# Patient Record
Sex: Female | Born: 1980 | Race: White | Hispanic: No | Smoking: Never smoker
Health system: Southern US, Community
[De-identification: ages and names within clinical notes are randomized; demographics above are authoritative.]

## PROBLEM LIST (undated history)

## (undated) ENCOUNTER — Inpatient Hospital Stay (HOSPITAL_COMMUNITY): Payer: Self-pay

## (undated) DIAGNOSIS — Z8619 Personal history of other infectious and parasitic diseases: Secondary | ICD-10-CM

## (undated) DIAGNOSIS — J45909 Unspecified asthma, uncomplicated: Secondary | ICD-10-CM

## (undated) DIAGNOSIS — O09899 Supervision of other high risk pregnancies, unspecified trimester: Secondary | ICD-10-CM

## (undated) DIAGNOSIS — Z8489 Family history of other specified conditions: Secondary | ICD-10-CM

## (undated) DIAGNOSIS — O99345 Other mental disorders complicating the puerperium: Secondary | ICD-10-CM

## (undated) DIAGNOSIS — N200 Calculus of kidney: Secondary | ICD-10-CM

## (undated) DIAGNOSIS — D649 Anemia, unspecified: Secondary | ICD-10-CM

## (undated) DIAGNOSIS — O09219 Supervision of pregnancy with history of pre-term labor, unspecified trimester: Secondary | ICD-10-CM

## (undated) DIAGNOSIS — F53 Postpartum depression: Secondary | ICD-10-CM

## (undated) DIAGNOSIS — E119 Type 2 diabetes mellitus without complications: Secondary | ICD-10-CM

## (undated) DIAGNOSIS — F419 Anxiety disorder, unspecified: Secondary | ICD-10-CM

## (undated) HISTORY — DX: Postpartum depression: F53.0

## (undated) HISTORY — PX: LITHOTRIPSY: SUR834

## (undated) HISTORY — DX: Other mental disorders complicating the puerperium: O99.345

## (undated) HISTORY — DX: Supervision of pregnancy with history of pre-term labor, unspecified trimester: O09.219

## (undated) HISTORY — DX: Personal history of other infectious and parasitic diseases: Z86.19

## (undated) HISTORY — PX: NO PAST SURGERIES: SHX2092

## (undated) HISTORY — PX: WISDOM TOOTH EXTRACTION: SHX21

## (undated) HISTORY — DX: Supervision of other high risk pregnancies, unspecified trimester: O09.899

---

## 2001-01-10 ENCOUNTER — Other Ambulatory Visit: Admission: RE | Admit: 2001-01-10 | Discharge: 2001-01-10 | Payer: Self-pay | Admitting: Obstetrics and Gynecology

## 2001-07-01 ENCOUNTER — Inpatient Hospital Stay (HOSPITAL_COMMUNITY): Admission: RE | Admit: 2001-07-01 | Discharge: 2001-07-04 | Payer: Self-pay | Admitting: Obstetrics and Gynecology

## 2003-06-07 ENCOUNTER — Encounter: Payer: Self-pay | Admitting: Urology

## 2003-06-07 ENCOUNTER — Ambulatory Visit (HOSPITAL_COMMUNITY): Admission: RE | Admit: 2003-06-07 | Discharge: 2003-06-07 | Payer: Self-pay | Admitting: Urology

## 2003-06-19 ENCOUNTER — Ambulatory Visit (HOSPITAL_COMMUNITY): Admission: RE | Admit: 2003-06-19 | Discharge: 2003-06-19 | Payer: Self-pay | Admitting: Urology

## 2003-06-19 ENCOUNTER — Encounter: Payer: Self-pay | Admitting: Urology

## 2003-06-26 ENCOUNTER — Ambulatory Visit (HOSPITAL_COMMUNITY): Admission: RE | Admit: 2003-06-26 | Discharge: 2003-06-26 | Payer: Self-pay | Admitting: Urology

## 2003-06-26 ENCOUNTER — Encounter: Payer: Self-pay | Admitting: Urology

## 2003-10-25 ENCOUNTER — Ambulatory Visit (HOSPITAL_COMMUNITY): Admission: RE | Admit: 2003-10-25 | Discharge: 2003-10-25 | Payer: Self-pay | Admitting: Family Medicine

## 2004-08-09 ENCOUNTER — Emergency Department (HOSPITAL_COMMUNITY): Admission: EM | Admit: 2004-08-09 | Discharge: 2004-08-09 | Payer: Self-pay | Admitting: Emergency Medicine

## 2005-12-01 ENCOUNTER — Ambulatory Visit (HOSPITAL_COMMUNITY): Admission: RE | Admit: 2005-12-01 | Discharge: 2005-12-01 | Payer: Self-pay | Admitting: Urology

## 2007-08-10 ENCOUNTER — Emergency Department (HOSPITAL_COMMUNITY): Admission: EM | Admit: 2007-08-10 | Discharge: 2007-08-10 | Payer: Self-pay | Admitting: Emergency Medicine

## 2008-01-25 ENCOUNTER — Other Ambulatory Visit: Admission: RE | Admit: 2008-01-25 | Discharge: 2008-01-25 | Payer: Self-pay | Admitting: Obstetrics and Gynecology

## 2008-10-03 ENCOUNTER — Inpatient Hospital Stay (HOSPITAL_COMMUNITY): Admission: AD | Admit: 2008-10-03 | Discharge: 2008-10-05 | Payer: Self-pay | Admitting: Obstetrics and Gynecology

## 2008-11-06 ENCOUNTER — Inpatient Hospital Stay (HOSPITAL_COMMUNITY): Admission: AD | Admit: 2008-11-06 | Discharge: 2008-11-06 | Payer: Self-pay | Admitting: Obstetrics and Gynecology

## 2009-03-07 ENCOUNTER — Inpatient Hospital Stay (HOSPITAL_COMMUNITY): Admission: AD | Admit: 2009-03-07 | Discharge: 2009-03-09 | Payer: Self-pay | Admitting: Obstetrics and Gynecology

## 2010-09-21 IMAGING — US US OB DETAIL+14 WK
2 series · 14 of 28 positions shown · non-contrast
Comparison: none

OBSTETRICAL ULTRASOUND:
 This ultrasound exam was performed in the [HOSPITAL] Ultrasound Department.  The OB US report was generated in the AS system, and faxed to the ordering physician.  This report is also available in [REDACTED] PACS.

[Series 1: us ob detail +14 wk · 13 of 84 slices shown (1 of 2)]
[im 4/84]
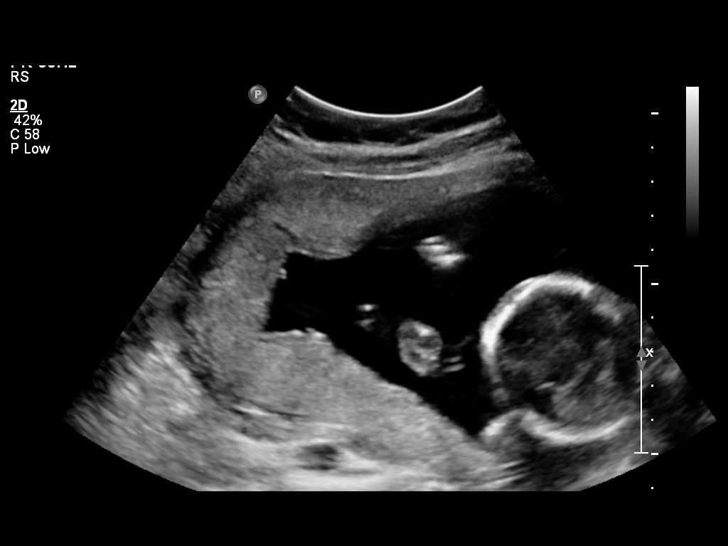
[im 10/84]
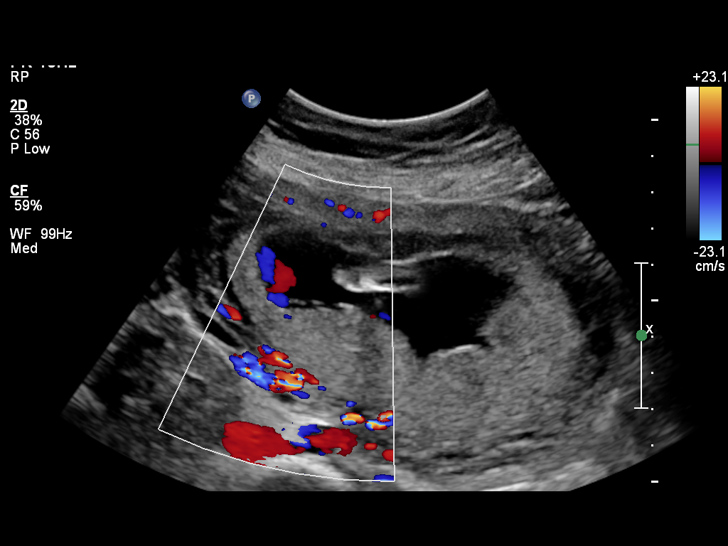
[im 16/84]
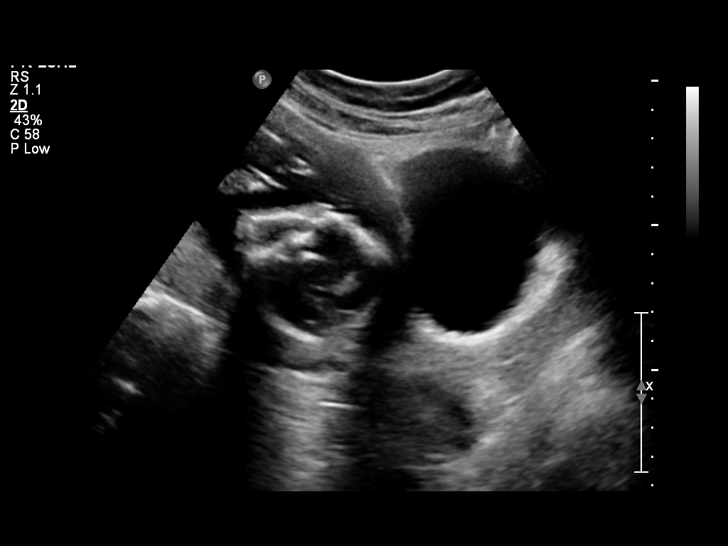
[im 23/84]
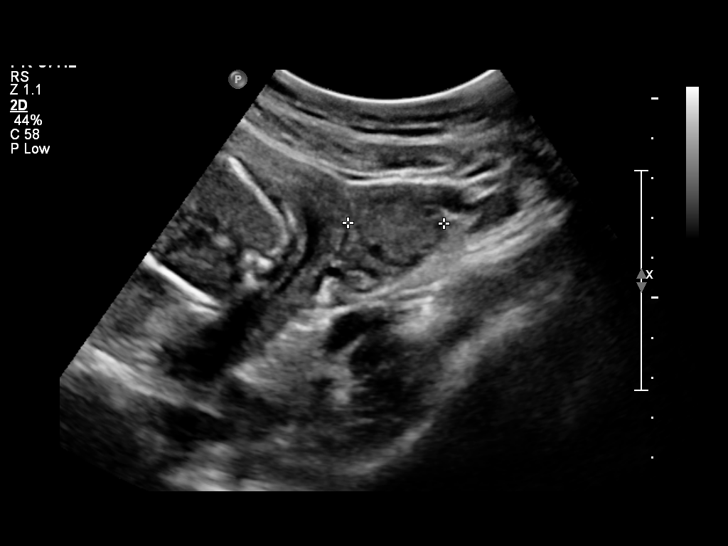
[im 29/84]
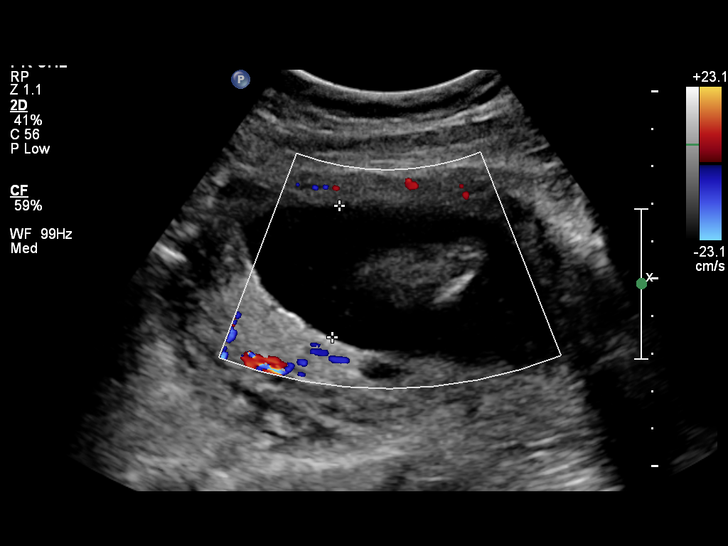
[im 36/84]
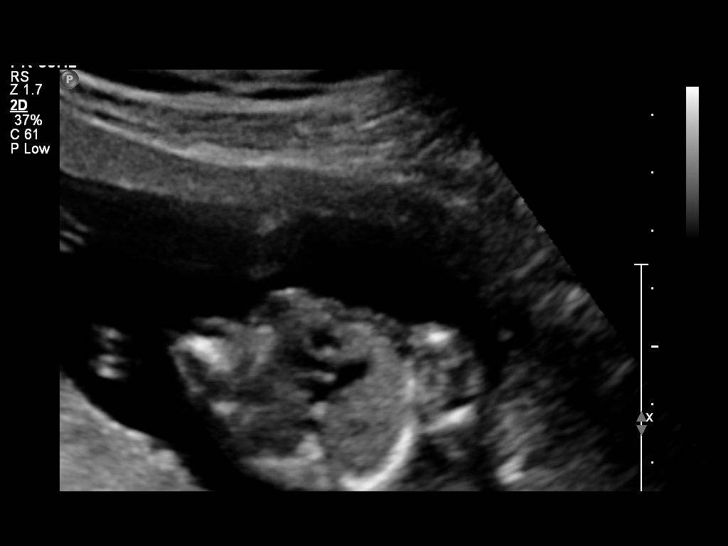
[im 42/84]
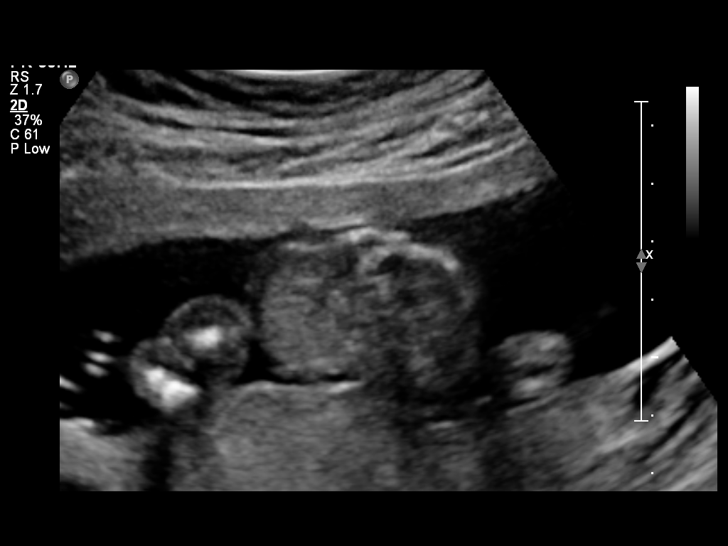
[im 48/84]
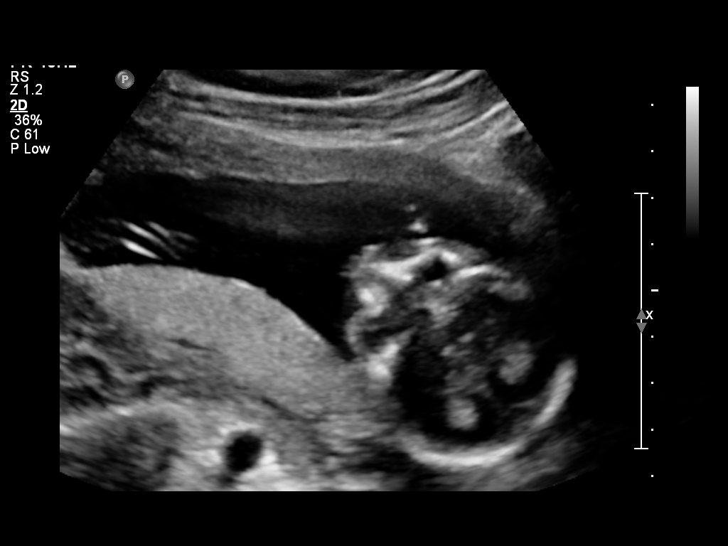
[im 55/84]
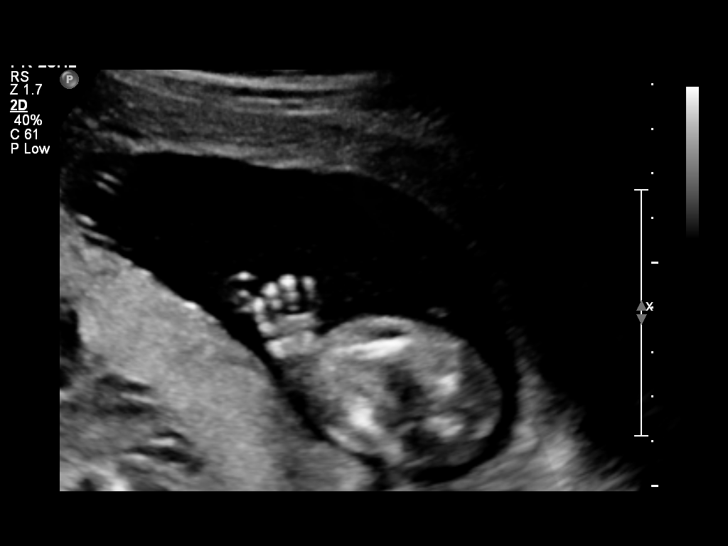
[im 61/84]
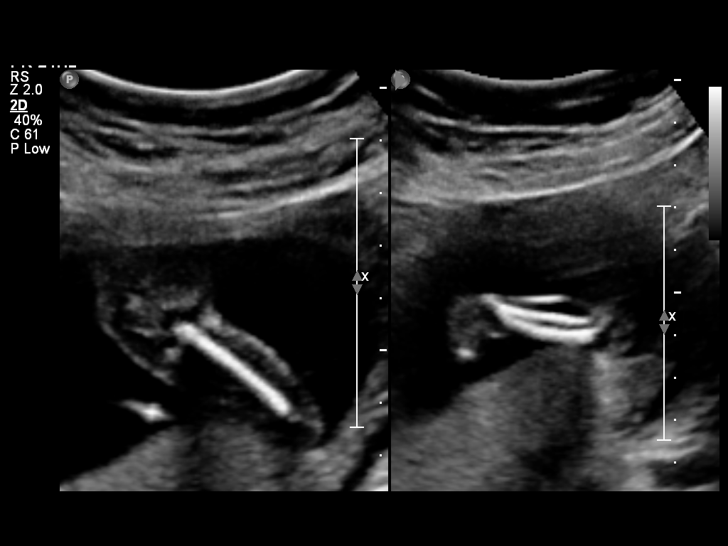
[im 68/84]
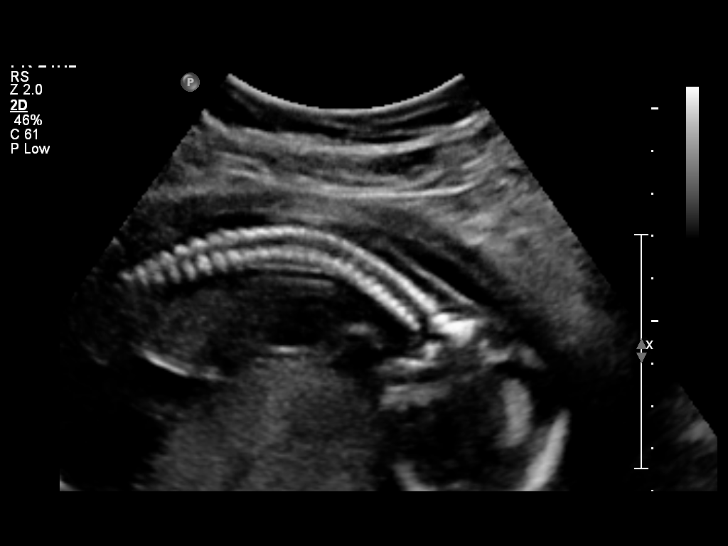
[im 74/84]
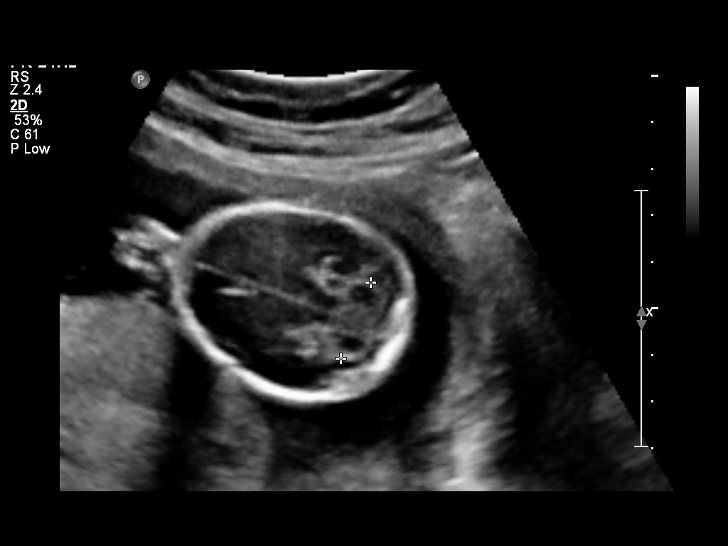
[im 80/84]
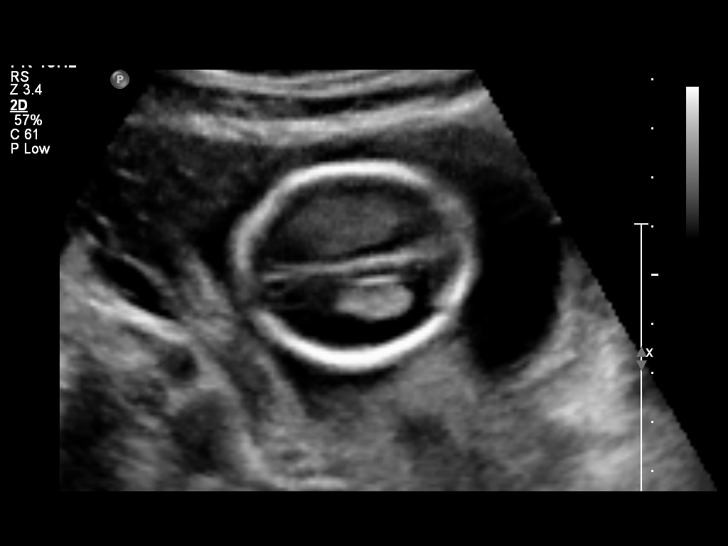

[Series 1: us ob detail +14 wk · 1 of 3 slices shown (2 of 2)]
[im 1/3]
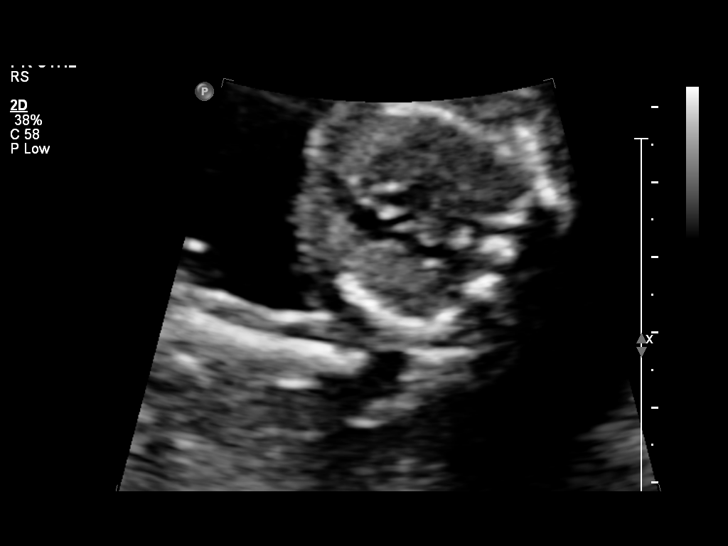

[14 of 28 positions shown; findings below may reference images not displayed]

IMPRESSION: See AS Obstetric US report.

## 2010-09-21 IMAGING — US US RENAL
1 series · 13 of 25 positions shown · non-contrast
Comparison: CT on 12/01/2005

CLINICAL DATA: 19 weeks estimated gestational age with pain and
known kidney stones.

RENAL/URINARY TRACT ULTRASOUND
TECHNIQUE: Complete ultrasound examination of the urinary tract
was performed including evaluation of the kidneys, renal collecting
systems, and urinary bladder.

[Series 1: us renal · 13 of 46 slices shown]
[im 1/46]
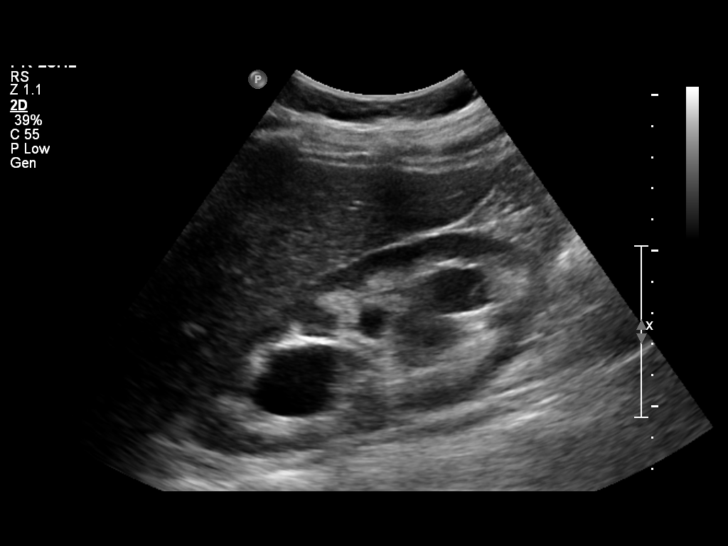
[im 4/46]
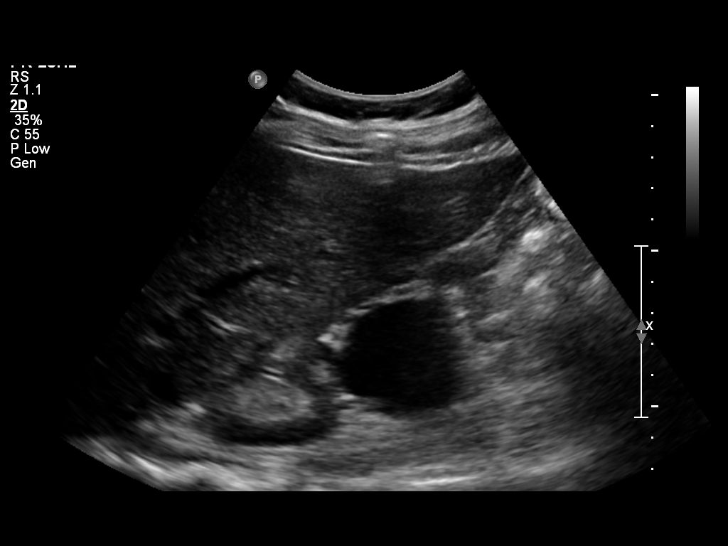
[im 8/46]
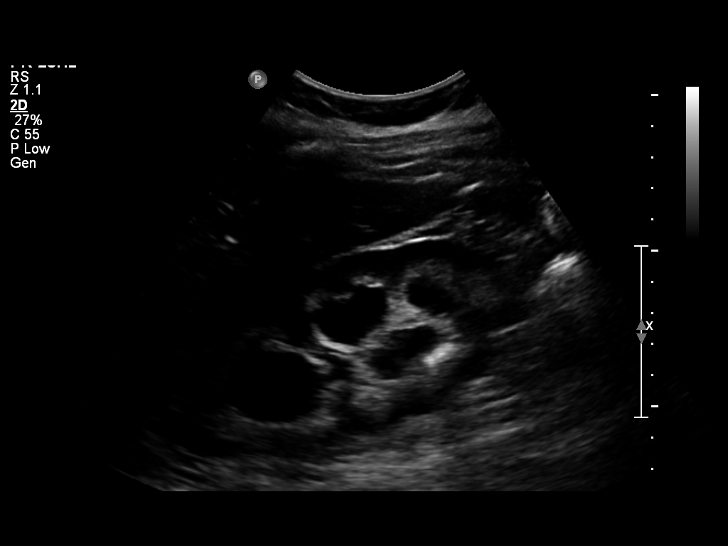
[im 12/46]
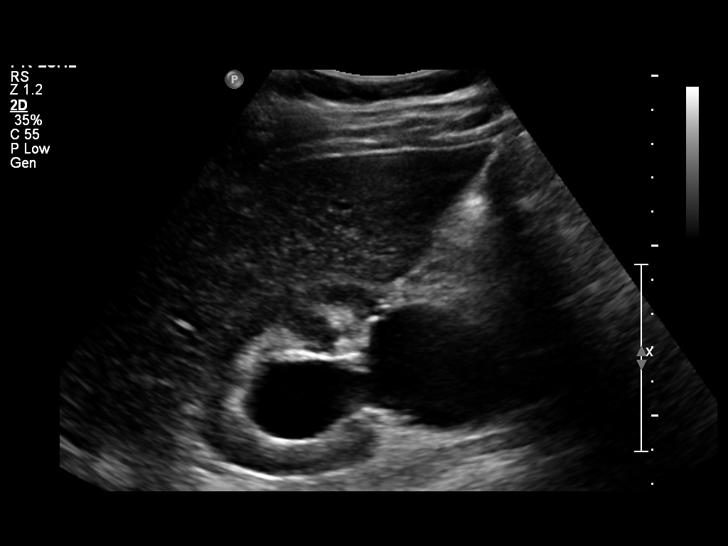
[im 16/46]
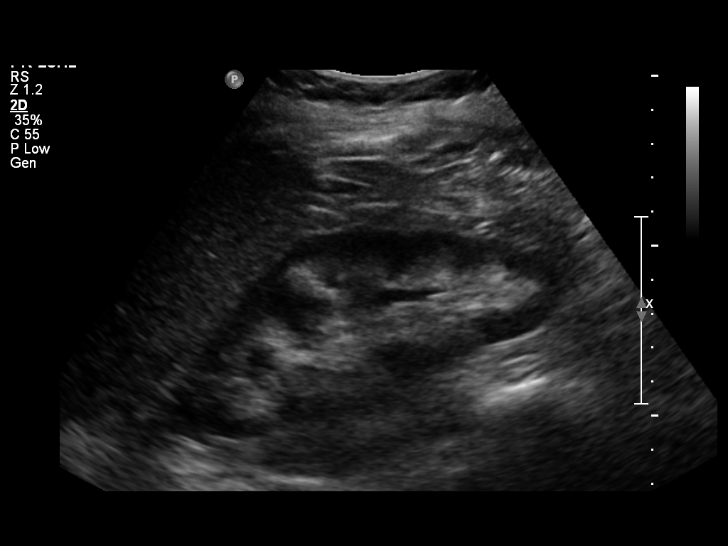
[im 19/46]
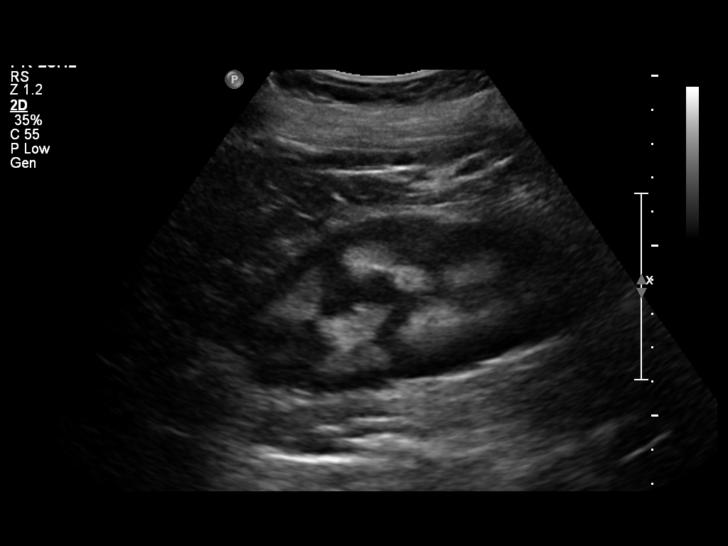
[im 23/46]
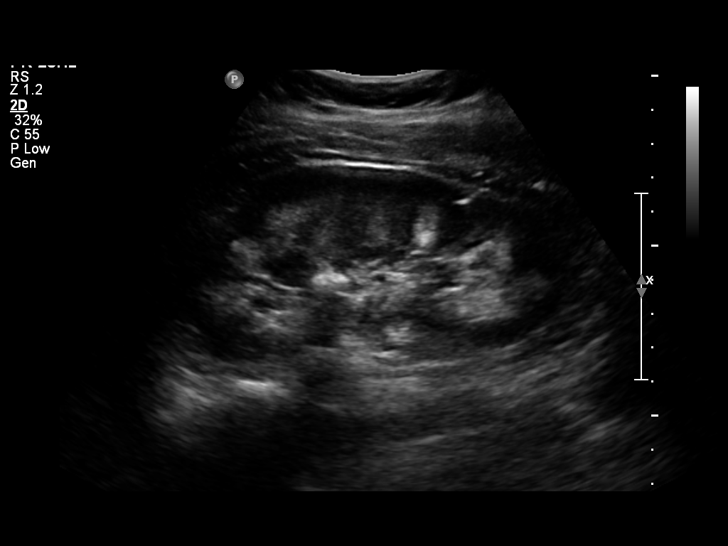
[im 27/46]
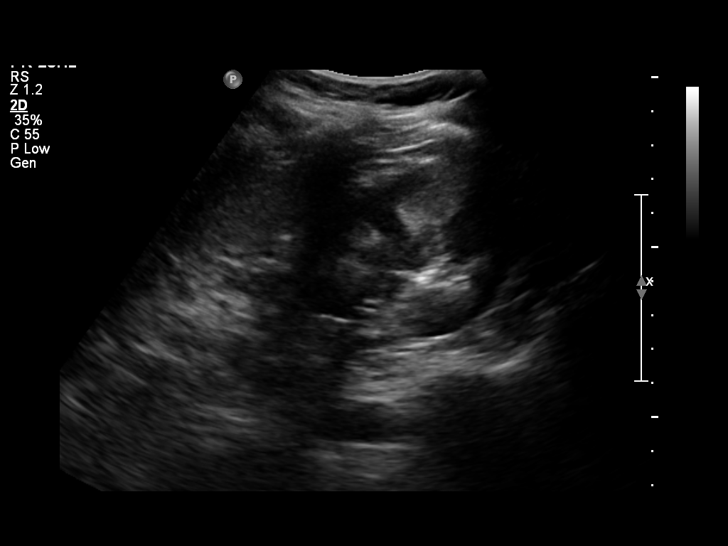
[im 31/46]
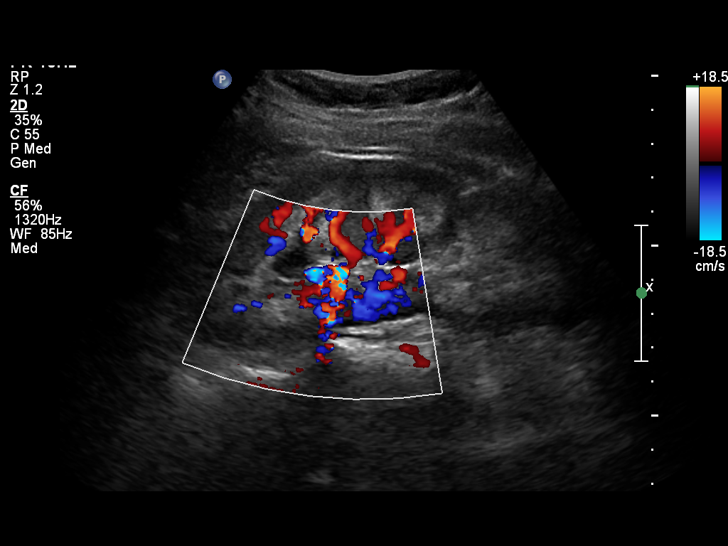
[im 34/46]
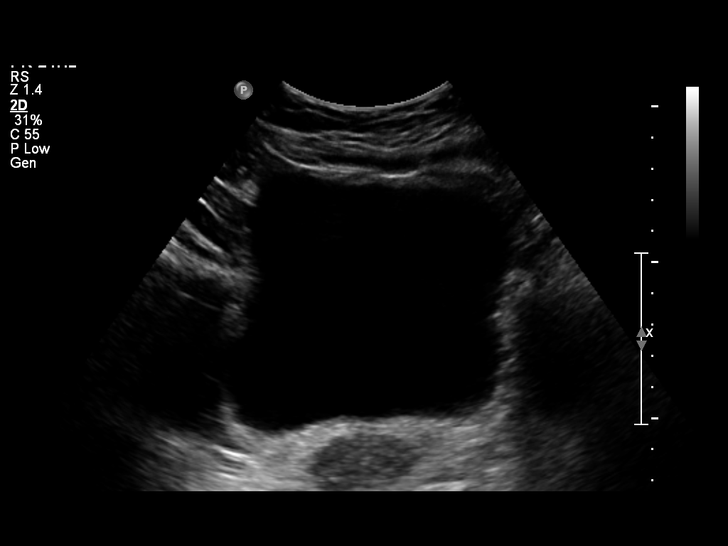
[im 38/46]
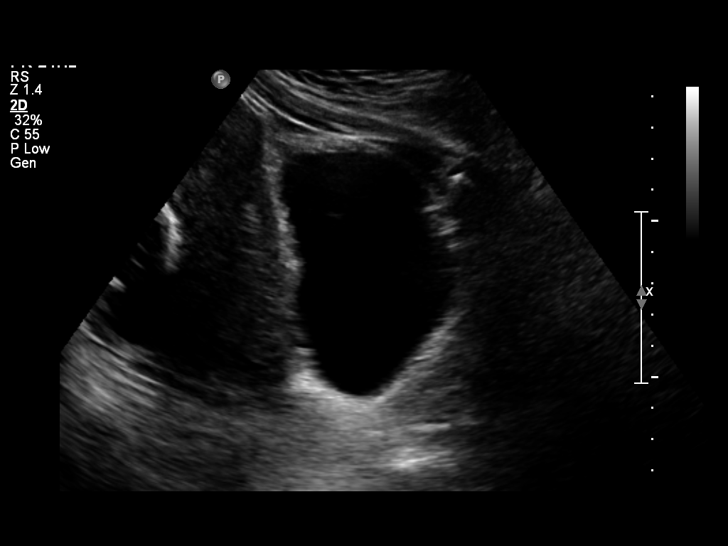
[im 42/46]
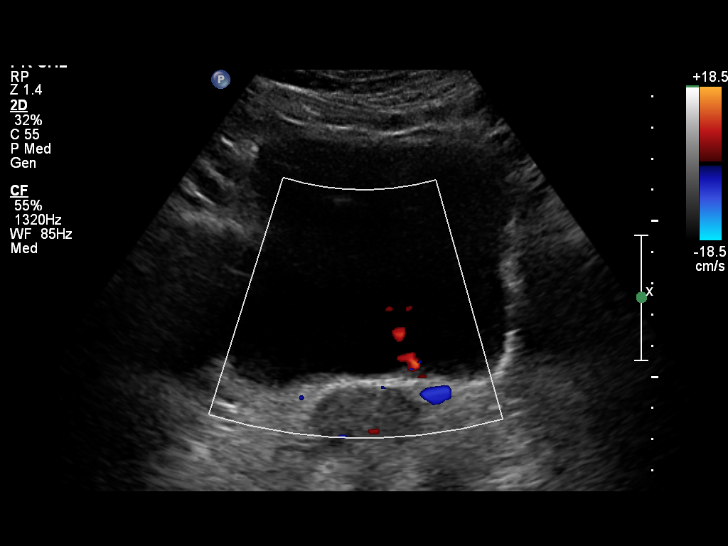
[im 46/46]
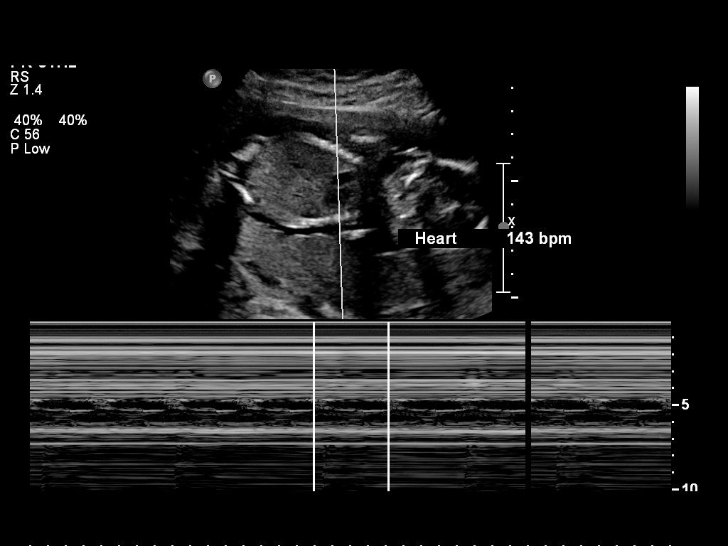

[13 of 25 positions shown; findings below may reference images not displayed]

FINDINGS: The right kidney demonstrates a sagittal length of
cm and the left kidney demonstrates a sagittal length of 12.4 cm.
Changes of bilateral medullary nephrocalcinosis are noted and this
was appreciated on prior CT dated 12/01/2005.  There is moderate
hydronephrosis identified on the right side with associated
caliectasis.  Dilatation of the renal pelvis and proximal aspect of
the right ureter was present.  Shadowing from bowel was present
overlying the proximal aspect of the ureter and a definite site of
obstruction was not seen.  No definite distention of the distal
aspect of the ureter was seen sonographically.  No evidence for a
right ureteral jet is noted and this constellation of findings is
concerning for a functionally obstructive process despite
nonvisualization of an obstructing renal stone.

No hydronephrosis is identified on the left and a strong left
ureteral jet was seen.

Bilateral renal calculi are appreciated on the right in the lower
pole measuring 8.7 mm and on the left in the upper pole measuring
4.0 and 6.0 meters directly adjacent to one another.

No perirenal fluid is noted.  The bladder has a normal filled
appearance.
IMPRESSION: Bilateral medullary nephrocalcinosis with bilateral renal calculi
identified having sizes and locations as noted.  Moderate
hydronephrosis is noted on the right with associated caliectasis
and proximal ureterectasis.  The distal ureter did not appear
distended but no right ureteral jet was identified during several
minutes of observation and this constellation of findings  is
worrisome for a functional obstruction on the right side possibly
from a non visualized right ureteral stone.

These study results were called to the patient's nurse on the third
floor.

## 2010-12-28 LAB — CBC
HCT: 26.3 % — ABNORMAL LOW (ref 36.0–46.0)
HCT: 31 % — ABNORMAL LOW (ref 36.0–46.0)
Hemoglobin: 10.9 g/dL — ABNORMAL LOW (ref 12.0–15.0)
Hemoglobin: 9.3 g/dL — ABNORMAL LOW (ref 12.0–15.0)
MCHC: 35.1 g/dL (ref 30.0–36.0)
MCV: 92.5 fL (ref 78.0–100.0)
Platelets: 169 10*3/uL (ref 150–400)
Platelets: 225 10*3/uL (ref 150–400)
RBC: 2.85 MIL/uL — ABNORMAL LOW (ref 3.87–5.11)
RBC: 3.35 MIL/uL — ABNORMAL LOW (ref 3.87–5.11)
WBC: 16.6 10*3/uL — ABNORMAL HIGH (ref 4.0–10.5)

## 2010-12-28 LAB — RPR: RPR Ser Ql: NONREACTIVE

## 2011-01-04 LAB — URINALYSIS, ROUTINE W REFLEX MICROSCOPIC
Bilirubin Urine: NEGATIVE
Ketones, ur: NEGATIVE mg/dL
Nitrite: NEGATIVE
Urobilinogen, UA: 0.2 mg/dL (ref 0.0–1.0)
pH: 6.5 (ref 5.0–8.0)

## 2011-01-04 LAB — URINE MICROSCOPIC-ADD ON

## 2011-01-05 LAB — URINALYSIS, ROUTINE W REFLEX MICROSCOPIC
Nitrite: NEGATIVE
Protein, ur: NEGATIVE mg/dL
Specific Gravity, Urine: 1.01 (ref 1.005–1.030)
Urobilinogen, UA: 0.2 mg/dL (ref 0.0–1.0)

## 2011-01-05 LAB — URINE MICROSCOPIC-ADD ON

## 2011-02-02 NOTE — Discharge Summary (Signed)
[name], Lauren [name]               ACCOUNT NO.:  [id number]   MEDICAL RECORD NO.:  [id number]          PATIENT TYPE:  INP   LOCATION:  9320                          FACILITY:  [facility]   PHYSICIAN:  Dineen Kid. Rana Snare, [name]    DATE OF BIRTH:  December 23, 1980   DATE OF ADMISSION:  10/03/2008  DATE OF DISCHARGE:  10/05/2008                               DISCHARGE SUMMARY   ADMITTING DIAGNOSES:  1. Intrauterine pregnancy at 18-1/2 weeks' estimated gestational age.  2. Hematuria suspect renal calculi.   DISCHARGE DIAGNOSES:  1. Intrauterine pregnancy at 61 weeks' estimated gestational age.  2. Nephrolithiasis.   HOSPITAL COURSE:  The patient is a 30 year old white married female that  was admitted to [facility] at 87 and 4 weeks' estimated  gestational age with complaints of flank pain with positive hematuria.  The patient was in moderate distress on admission.  Fetal heart tones  were in 140s.  The patient was now admitted for pain management and  workup regarding possible nephrolithiasis.  The patient was started on a  PCA pump for her pain management.  Urine was being strained on the  following morning.  The patient continued to have some intermittent  colicky right flank pain.  She did report some occasional fetal  movement.  Vital signs were stable.  She is afebrile.  Abdomen was soft  with some right flank pain, which was relieved with a PCA pump.  Uterus  was gravid at about a -1 station.  Urinalysis had revealed moderate  hematuria, and negative for nitrites, negative for leukocyte esterase.  The preliminary CT scan had revealed some right hydronephrosis without  Uro-jet seen.  Her renal consult was made and OB ultrasound was  scheduled.  Decision was made for conservative treatment.  She continued  to strain urine with p.r.n. narcotics for pain and nausea.  If pain  increased, decision was made to perhaps schedule an MRI to look for a  stone and a possible cystoscopy and  urethroscopic with a right  retrograde pyelogram and double-J stent placement.  The patient was in  agreement with the plan.  On the following morning, the patient was  reported that the pain seemed to be much better with a question whether  she had possibly been passed a stone.  Vital signs were stable.  She had  noted the flank pain at that time.  Abdomen continued to be soft.  Decision was made to discharge the patient to home with follow up in the  office in 3-4 days.   CONDITION ON DISCHARGE:  Stable.   DIET:  Regular as tolerated.   ACTIVITY:  Up as tolerated.  She is to call for increase in flank pain,  fever, chills, or increase in dysuria.  Prescription medication given  for Tylox #30, 1 p.o. daily.  Continue prenatal vitamins 1 p.o. daily.      Julio Sicks, N.P.      Dineen Kid Rana Snare, [name]  Electronically Signed    CC/MEDQ  D:  10/30/2008  T:  10/30/2008  Job:  [id number]

## 2011-02-02 NOTE — Consult Note (Signed)
NAMEJAYLEENE, GLAESER               ACCOUNT NO.:  [id number]   MEDICAL RECORD NO.:  [id number]          PATIENT TYPE:  INP   LOCATION:  9320                          FACILITY:  [facility]   PHYSICIAN:  Sigmund I. Patsi Sears, M.D.DATE OF BIRTH:  1980/10/27   DATE OF CONSULTATION:  DATE OF DISCHARGE:                                 CONSULTATION   CHIEF COMPLAINT:  Right flank pain x24 hours and history of kidney  stones.   HISTORY OF PRESENT ILLNESS:  This is a 30 year old female that is  admitted to [facility] on October 03, 2008, for intermittent  colicky right flank pain with a history of kidney stones in the past  treated by a lithotripsy.  Ms. Sangiovanni is 74 weeks' intrauterine  pregnancy and has been having continual right flank pain for several  days, and at this point is being admitted for IV hydration and pain  control.   ALLERGIES:  No known drug allergies.   FAMILY HISTORY:  Unknown.   SOCIAL HISTORY:  A 30 year old female that denies any history of  tobacco, drug, or alcohol use.   SURGICAL HISTORY:  Status post lithotripsy in the past.   MEDICATIONS:  1. Stadol 1 mg IV p.r.n.  2. Phenergan 12.5 mg p.r.n.  3. Morphine sulfate PCA.  4. Zofran 8 mg IV q.8 h. p.r.n.  5. Compazine 5-10 mg IV q.6 h. p.r.n.  6. Benadryl 25 mg p.o. q.6 h. p.r.n.   PHYSICAL EXAMINATION:  GENERAL:  A well-developed, well-nourished 71-  year-old white female in no acute distress.  VITAL SIGNS:  Today are blood pressure 115/74, respirations 20, pulse  74, temperature 97.7, room air sats of 100.  HEENT:  Normocephalic, atraumatic.  Pupils equal and reactive to light.  HEART:  Regular rhythm and rate without murmurs, gallops, or rubs.  LUNGS:  Clear to auscultation.  ABDOMEN:  Soft, nontender.  Positive right CVA tenderness.  Gravid  uterus.  GU/RECTAL:  Deferred.  SKIN:  Normal inspection.  PSYCHOLOGIC:  Alert, oriented, and cooperative.   LABORATORY DATA:  Urinalysis shows specific  gravity of 1.015, pH is 6.5,  shows a moderate blood and otherwise negative UA.  Microscopic shows  wbc's 0-2 and rbc's 3-6.  Renal ultrasound on October 04, 2008, shows  bilateral medullary nephrocalcinosis with bilateral renal calculi  identified, having sizes and locations as noted.  Moderated  hydronephrosis is noted on the right with associated calcinosis and  proximally ureterectasis.  The distal ureter did not appear distended,  but no right ureteral jet was identified during several minutes of  observation.  This constellation of findings is worrisome for a  functional obstruction of the right side, possibly from a nonvisualized  right renal stone.   ASSESSMENT AND PLAN:  History of kidney stones, right renal flank pain,  and hydronephrosis.  Discussed in detail with the patient and family  members conservative treatments and she is 19 weeks' intrauterine  pregnancy, to have the patient continue to strain her urine, controlled  with p.r.n. narcotics for pain and nausea, and plan would be at OB's  discretion to  discharge home on conservative pain medicine and to  followup in our office for a renal ultrasound.  I did discuss if the  patient becomes more acute in regards of, i.e., septic or developed  severe pain that was not controlled then we would most likely recommend  an MRI to look for a stone and also possible cystoscopy, ureteroscopy,  and right retrograde polygram, and double J-stent placement by Dr.  Patsi Sears.  The patient is in agreement with this plan at this time and  we will follow at a distance, please notify us if you need [place] in the  future.      Jetta Lout, NP      Sigmund I. Patsi Sears, [name]  Electronically Signed    DW/MEDQ  D:  10/04/2008  T:  10/05/2008  Job:  [id number]

## 2011-02-05 NOTE — Op Note (Signed)
[facility]  Patient:    Lauren [name], Lauren [name] Visit Number: [id number] MRN: [id number]          Service Type: OBS Location: 4 A427 01 Attending Physician:  Tilda Burrow Dictated by:   Langley Gauss, [name] Proc. Date: 07/01/01 Admit Date:  07/01/2001 Discharge Date: 07/04/2001   CC:         [name], [name]   Operative Report  PROCEDURE:  Placement of continuous lumbar analgesia at the L3-L4 interspace.  SURGEON:  Langley Gauss, [name]  COMPLICATIONS:  None.  SPECIMENS:  None.  SUMMARY:  Appropriate informed consent was obtained.  Continuous electronic fetal monitoring was performed.  The patient was placed in the standard position at which time bony landmarks were identified.  The L3-L4 interspace was chosen.  The patients back is sterilely prepped and draped in the usual manner.  Then 5 cc of 1% lidocaine is injected at the midline of the L3-L4 interspace to raise a small skin wheal.  The 17-gauge Tuohy-Schliff needle is then utilized with loss of resistance from the air-filled glass syringe to identify entry into the epidural space; first attempt without difficulty. Test dose 5 cc of 1.5% lidocaine plus epinephrine was injected through the epidural needle.  No signs of CSF or intravascular injection obtained.  Thus, the epidural catheter was inserted to a depth of 4 cm.  Epidural needle was removed.  Aspiration test was negative.  A second test dose of 2 cc 1.5% lidocaine plus epinephrine was injected through the epidural catheter.  Again, no signs of CSF or intravascular injection obtained.  The patient is having tingling in her feet, promptly setting up epidural block to facilitate rapid onset of perineal block.  The patient is treated with 5 cc of 2% lidocaine plain.  The patient tolerated this very well.  Upon return to the left lateral supine position, she was noted to have significant relief for labor analgesia. Specifically, the  right leg is very numb.  The left leg is beginning to feel tingly.  The patient, however, is tolerating contractions very well at this time.  Immediately prior to placement of the epidural, the patient had been examined and noted to be 8 cm dilated, completely effaced at a +1 station. Upon return to the bed re-examination reveals cervix to be unchanged, 8 cm, completely effaced and +1 station.  Fetal heart rate remains reassuring throughout the procedure.Dictated by:   Langley Gauss, [name] Attending Physician:  Tilda Burrow DD:  07/02/01 TD:  07/02/01 Job: [id number]

## 2011-02-05 NOTE — Op Note (Signed)
[facility]  Patient:    MINIE, ROADCAP Visit Number: [id number] MRN: [id number]          Service Type: OBS Location: 4 A427 01 Attending Physician:  Tilda Burrow Dictated by:   Langley Gauss, [name] Proc. Date: 07/01/01 Admit Date:  07/01/2001 Discharge Date: 07/04/2001                             Operative Report  INCOMPLETE  PROCEDURE:  Placement of continuous lumbar epidural analgesia to L3-L4 interspace.  SURGEON:  Langley Gauss, [name]  COMPLICATIONS: Dictated by:   Langley Gauss, [name] Attending Physician:  Tilda Burrow DD:  07/31/01 TD:  07/31/01 Job: [id number]

## 2011-02-05 NOTE — Op Note (Signed)
[facility]  Patient:    Lauren [name], Lauren [name] Visit Number: [id number] MRN: [id number]          Service Type: OBS Location: 4 A427 01 Attending Physician:  Tilda Burrow Dictated by:   Langley Gauss, [name] Proc. Date: 07/01/01 Admit Date:  07/01/2001 Discharge Date: 07/04/2001   CC:         [name], [name]   Operative Report  PROCEDURE:  Placement of continuous lumbar epidural analgesia to L3-L4 interspace.  SURGEON:  Langley Gauss, [name]  COMPLICATIONS:  None.  SUMMARY:  With the onset of active labor, the patient requested epidural analgesia.  Continuous electronic fetal monitoring was performed.  The patient was placed in a seated position at which time bony landmarks were identified. The L3-L4 interspace was chosen.  The patients back was sterilely prepped in the usual manner; 5 cc of 1% lidocaine injected at the midline of the L3-L4 interspace to raise a small skin wheal.  The 17-gauge Tuohy Schliff needle was then utilized with an air-filled glass syringe to properly and atraumatically identify and enter into the epidural space on the first attempt without difficulty.  Excellent loss of resistance is noted.  No signs of CSF or intravascular injection obtained.  Initial test dose 5 cc 1.5% lidocaine plus epinephrine injected through the epidural needle.  No signs of CSF or intravascular injection obtained.  Thus, the catheter was inserted to a depth of 4 cm.  Epidural needle was removed.  The aspiration test is negative. Second test dose of 2 cc 1.5% lidocaine plus epinephrine is injected through the epidural catheter.  Again, no signs of CSF or intravascular injection obtained.  The patient is having some tingling in the extremities bilaterally, particularly the lower legs consistent with proper setting of epidural block. Thus, she is connected to the infusion pump.  She will be treated with a 10 cc bolus followed by an infusion rate of  12 cc per hour.  At the completion of this bolus, the patient has evidence of quickly setting up bilateral epidural block with appropriate labor analgesia. Dictated by:   Langley Gauss, [name] Attending Physician:  Tilda Burrow DD:  07/31/01 TD:  07/31/01 Job: [id number]

## 2011-02-05 NOTE — Op Note (Signed)
[facility]  Patient:    Lauren [name] Visit Number: [id number] MRN: [id number]          Service Type: OBS Location: 4 A427 01 Attending Physician:  Lauren [name] Dictated by:   Lauren [name], [name] Proc. Date: 07/01/01 Admit Date:  07/01/2001 Discharge Date: 07/04/2001   CC:         Lauren [name], [name]   Operative Report  PROCEDURE: 1. Outlet vacuum extraction, 6 pound 0.2 ounce female infant. 2. Midline episiotomy and repair.  SURGEON:  Lauren [name], [name]  ESTIMATED BLOOD LOSS:  Less than 500 cc.  COMPLICATIONS:  None.  ANALGESIA FOR DELIVERY:  Continuous lumbar epidural.  This is supplemented with 30 cc of 1% lidocaine in the midline and the perineal body.  SPECIMENS:  Arterial cord gas and cord blood to pathology.  Likewise, the placenta is sent to pathology with the diagnosis of prematurity.  INDICATIONS:  The patient is 34-4/[redacted] weeks gestation.  She had been admitted by Lauren [name] in early labor and with preterm premature spontaneous rupture of membranes.  The patient was noted to have reassuring fetal heart rate upon admission.  She was afebrile with no fetal tachycardia.  The patient was treated with 2 g of IV ampicillin followed by 1 g q.4h. due to the high risk of group B strep.  The patient did require Pitocin augmentation secondary to a dysfunctional labor pattern.  She had inadequate frequency and intensity of uterine contractions and Pitocin augmentation was initiated.   This has achieved a functional labor pattern with discomfort associated with uterine contractions.  The patient was initially treated with about 10 mg of IV Nubain.  However, with descent of the vertex, there was significant peroneal pressure and desire to push.  The patient had that point in time requested epidural analgesic.  By report, nursing staff stated she was 5 cm at that time.  When I arrived to place the epidural, the patient felt like  she had the urge to push.  She was examined at that time and noted to be 8 cm dilated, completely effaced, +1 station.  Thus, there was an opportunity to place the epidural.  The patient was placed in the seated position.  Epidural was placed without difficulty.  Epidural functioned well throughout the remainder of the hospital course.  About one hour later, the patient was noted to be completely dilated with a strong urge to push.  She was placed in the dorsal lithotomy position, prepped and draped in the usual sterile manner.  She pushed well with descent of the vertex to the perineal floor with distention of the perineum.  A small midline episiotomy was performed.  The patient began having some decelerations at this time, various type and nature.  Thus, outlet vacuum extraction was performed.  The Kiwi vacuum extractor was placed on the infants vertex.  General suction was then applied at all times keeping within a safe drain range.  With a combination of suction and gentle traction, there was easy delivery in a direct LOA position over a midline episiotomy without extension.  Mouth and nares of the infant were bulb suctioned of clear amniotic fluid.  Continued expulsive efforts revealed spontaneous rotation to left anterior shoulder position.  Gentle abdominal contraction combined with expulsive efforts resulted in delivery of the shoulder and pubic symphysis without difficulty.  The umbilical cord was then milked toward the infant and cord was doubly clamped and cut and the infant was  taken to the nursery for immediate assessment.  Lauren [name] was present at the time of delivery. Arterial cord gas and cord blood are then obtained.  Gentle traction of the umbilical cord resulted in separation which upon examination appears to be intact three vessel placenta.  No odors identified.  Careful examination of the genital tract at this time reveals a midline episiotomy which is  not extended.  This is easily repaired with 3-0 chromic in a running locked fashion in the vaginal mucosa followed by two layer closure of 0 chromic on the perineal body.  The patient tolerated the delivery very well.  She was taken out of the dorsal lithotomy position, rolled to her side at which time the epidural catheter was removed.  The _____ tip was noted to be intact and shown to the nursing staff.  Lauren [name] has cared for the premature infant which by initial report is doing very well. Dictated by:   Lauren [name], [name] Attending Physician:  Lauren [name] DD:  07/02/01 TD:  07/02/01 Job: [id number]

## 2011-06-29 LAB — POCT I-STAT CREATININE
Creatinine, Ser: 0.9
Operator id: 216461

## 2011-06-29 LAB — I-STAT 8, (EC8 V) (CONVERTED LAB)
Acid-Base Excess: 2
Bicarbonate: 23.7
Glucose, Bld: 86
HCT: 41
Hemoglobin: 13.9
Potassium: 3.5
Sodium: 139
TCO2: 25
pH, Ven: 7.534 — ABNORMAL HIGH

## 2011-06-29 LAB — PREGNANCY, URINE: Preg Test, Ur: NEGATIVE

## 2012-09-20 DIAGNOSIS — N200 Calculus of kidney: Secondary | ICD-10-CM

## 2012-09-20 HISTORY — DX: Calculus of kidney: N20.0

## 2012-09-20 NOTE — L&D Delivery Note (Signed)
Delivery Note  SVD viable female Apgars 8,8 over intact perineum.  Placenta delivered spontaneously intact with 3VC. good support and hemostasis noted and R/V exam confirms.  PH art was sent.  [place] cord blood was not done.  Mother and baby were doing well.  EBL 300cc  Candice Camp, MD

## 2012-12-11 LAB — OB RESULTS CONSOLE RUBELLA ANTIBODY, IGM: Rubella: IMMUNE

## 2012-12-11 LAB — OB RESULTS CONSOLE ABO/RH: RH Type: POSITIVE

## 2012-12-11 LAB — OB RESULTS CONSOLE HIV ANTIBODY (ROUTINE TESTING): HIV: NONREACTIVE

## 2012-12-11 LAB — OB RESULTS CONSOLE HEPATITIS B SURFACE ANTIGEN: Hepatitis B Surface Ag: NEGATIVE

## 2013-01-21 ENCOUNTER — Inpatient Hospital Stay (HOSPITAL_COMMUNITY)
Admission: AD | Admit: 2013-01-21 | Discharge: 2013-01-21 | Disposition: A | Discharge status: Home / Self Care | Payer: 59 | Source: Ambulatory Visit | Attending: Obstetrics and Gynecology | Admitting: Obstetrics and Gynecology

## 2013-01-21 ENCOUNTER — Encounter (HOSPITAL_COMMUNITY): Payer: Self-pay

## 2013-01-21 DIAGNOSIS — R51 Headache: Secondary | ICD-10-CM | POA: Insufficient documentation

## 2013-01-21 DIAGNOSIS — O26899 Other specified pregnancy related conditions, unspecified trimester: Secondary | ICD-10-CM

## 2013-01-21 DIAGNOSIS — IMO0002 Reserved for concepts with insufficient information to code with codable children: Secondary | ICD-10-CM | POA: Insufficient documentation

## 2013-01-21 DIAGNOSIS — O26892 Other specified pregnancy related conditions, second trimester: Secondary | ICD-10-CM

## 2013-01-21 DIAGNOSIS — R519 Headache, unspecified: Secondary | ICD-10-CM

## 2013-01-21 DIAGNOSIS — O99891 Other specified diseases and conditions complicating pregnancy: Secondary | ICD-10-CM | POA: Insufficient documentation

## 2013-01-21 HISTORY — DX: Calculus of kidney: N20.0

## 2013-01-21 HISTORY — DX: Anxiety disorder, unspecified: F41.9

## 2013-01-21 LAB — URINALYSIS, ROUTINE W REFLEX MICROSCOPIC
Protein, ur: NEGATIVE mg/dL
Specific Gravity, Urine: 1.015 (ref 1.005–1.030)

## 2013-01-21 NOTE — MAU Note (Signed)
Been having headaches since Easter. No history of BP issues, but has been higher than normal for last few weeks. Saw "floaters" in vision last week. History of "pick axe" headaches. BLE swelling x 1 week.

## 2013-01-21 NOTE — MAU Provider Note (Signed)
  History     CSN: [id number]  Arrival date and time: 01/21/13 2144   First Provider Initiated Contact with Patient 01/21/13 2225      Chief Complaint  Patient presents with  . Headache  . Leg Swelling   HPI  Pt is a G3P1102 at 15.6 wks IUP here with intermittent headaches since Easter.  No medication for headache today.  Feels that it is a headache that would respond to Tylenol.   No medical history of headaches.  +frontal headache.  BLE swelling x 1 week.  Swelling has improved today.  Stands on her feet at work throughout the week and notices swelling is worse at that time.       Past Medical History  Diagnosis Date  . Kidney stone 2014  . Anxiety     no meds currently    Past Surgical History  Procedure Laterality Date  . Lithotripsy      Family History  Problem Relation Age of Onset  . Hypertension Mother   . Hyperlipidemia Mother   . Diabetes Mother   . Heart disease Father   . Heart disease Paternal Grandfather     History  Substance Use Topics  . Smoking status: Never Smoker   . Smokeless tobacco: Not on file  . Alcohol Use: No    Allergies: No Known Allergies  Prescriptions prior to admission  Medication Sig Dispense Refill  . acetaminophen (TYLENOL) 325 MG tablet Take 650 mg by mouth every 6 (six) hours as needed for pain.      Marland Kitchen HYDROcodone-acetaminophen (NORCO/VICODIN) 5-325 MG per tablet Take 1 tablet by mouth every 6 (six) hours as needed for pain.      . Prenatal Vit-Fe Fumarate-FA (PRENATAL MULTIVITAMIN) TABS Take 1 tablet by mouth daily at 12 noon.        Review of Systems  Musculoskeletal:       Bilat pedal edema  Neurological: Positive for headaches.  All other systems reviewed and are negative.   Physical Exam   Blood pressure 136/88, pulse 73, temperature 98.8 F (37.1 C), temperature source Oral, resp. rate 18, height 5' 4.5" (1.638 m), weight 73.936 kg (163 lb), SpO2 100.00%.  Physical Exam  Constitutional: She is oriented to  person, place, and time. She appears well-developed and well-nourished. No distress.  HENT:  Head: Normocephalic.  Eyes: Pupils are equal, round, and reactive to light.  Neck: Normal range of motion. Neck supple.  Cardiovascular: Normal rate, regular rhythm and normal heart sounds.   Respiratory: Effort normal and breath sounds normal.  Genitourinary: No bleeding around the vagina.  Musculoskeletal: Normal range of motion. She exhibits no edema.  Neurological: She is alert and oriented to person, place, and time.  Skin: Skin is warm and dry.    MAU Course  Procedures Pt declines Tylenol in MAU (wanted to make sure swelling and headaches were not serious at this time of pregnancy) Consulted with Dr. Rana Snare > reviewed HPI/exam > discharge home, provide reassurance  Assessment and Plan  Headaches in Pregnancy  Plan: Take OTC Tylenol Provided reassurance Follow-up in office on Wednesday.  [facility] 01/21/2013, 10:26 PM

## 2013-05-02 ENCOUNTER — Encounter: Payer: 59 | Attending: Obstetrics and Gynecology | Admitting: *Deleted

## 2013-05-02 VITALS — Ht 65.0 in | Wt 171.2 lb

## 2013-05-02 DIAGNOSIS — Z713 Dietary counseling and surveillance: Secondary | ICD-10-CM | POA: Insufficient documentation

## 2013-05-02 DIAGNOSIS — O99810 Abnormal glucose complicating pregnancy: Secondary | ICD-10-CM | POA: Insufficient documentation

## 2013-05-03 ENCOUNTER — Encounter: Payer: Self-pay | Admitting: *Deleted

## 2013-05-08 NOTE — Patient Instructions (Signed)
Goals:  Check glucose levels per MD as instructed  Follow Gestational Diabetes Diet as instructed  Call for follow-up as needed    

## 2013-05-08 NOTE — Progress Notes (Signed)
  Patient was seen on 05/02/13 for Gestational Diabetes self-management class at the [facility]. The following learning objectives were met by the patient during this course:   States the definition of Gestational Diabetes  States why dietary management is important in controlling blood glucose  Describes the effects each nutrient has on blood glucose levels  Demonstrates ability to create a balanced meal plan  Demonstrates carbohydrate counting   States when to check blood glucose levels  Demonstrates proper blood glucose monitoring techniques  States the effect of stress and exercise on blood glucose levels  States the importance of limiting caffeine and abstaining from alcohol and smoking  Blood glucose monitor given: Accu Chek Nano BG Monitoring Kit Lot # 101700 Exp: 06/19/14 Blood glucose reading: 95  Patient instructed to monitor glucose levels: FBS: 60 - <90 1 hour: <140 2 hour: <120  *Patient received handouts:  Nutrition Diabetes and Pregnancy  Carbohydrate Counting List  Patient will be seen for follow-up as needed.  

## 2013-05-17 ENCOUNTER — Encounter: Payer: Self-pay | Admitting: Obstetrics and Gynecology

## 2013-06-16 ENCOUNTER — Encounter (HOSPITAL_COMMUNITY): Payer: Self-pay | Admitting: *Deleted

## 2013-06-16 ENCOUNTER — Inpatient Hospital Stay (HOSPITAL_COMMUNITY)
Admission: AD | Admit: 2013-06-16 | Discharge: 2013-06-16 | Disposition: A | Discharge status: Home / Self Care | Payer: 59 | Source: Ambulatory Visit | Attending: Obstetrics and Gynecology | Admitting: Obstetrics and Gynecology

## 2013-06-16 DIAGNOSIS — J069 Acute upper respiratory infection, unspecified: Secondary | ICD-10-CM

## 2013-06-16 DIAGNOSIS — J3489 Other specified disorders of nose and nasal sinuses: Secondary | ICD-10-CM | POA: Insufficient documentation

## 2013-06-16 DIAGNOSIS — R059 Cough, unspecified: Secondary | ICD-10-CM | POA: Insufficient documentation

## 2013-06-16 DIAGNOSIS — O99891 Other specified diseases and conditions complicating pregnancy: Secondary | ICD-10-CM | POA: Insufficient documentation

## 2013-06-16 DIAGNOSIS — R05 Cough: Secondary | ICD-10-CM | POA: Insufficient documentation

## 2013-06-16 HISTORY — DX: Type 2 diabetes mellitus without complications: E11.9

## 2013-06-16 HISTORY — DX: Anemia, unspecified: D64.9

## 2013-06-16 LAB — URINE MICROSCOPIC-ADD ON

## 2013-06-16 LAB — URINALYSIS, ROUTINE W REFLEX MICROSCOPIC
Hgb urine dipstick: NEGATIVE
Ketones, ur: NEGATIVE mg/dL
Urobilinogen, UA: 0.2 mg/dL (ref 0.0–1.0)
pH: 7 (ref 5.0–8.0)

## 2013-06-16 NOTE — MAU Note (Signed)
Pt presents with allergy and sinus symptoms since yesterday morning which increased yesterday evening with a more significant cough.   Pt woke up at 2330 with "coughing and burning in my chest" which worsened this morning with " throbbing in my left side and neck."  Pt has been increasingly feeling worse as the day " has gone on".  Pt took mucinex about 0950 this am and "I feel worse since taking the medicine."  Pt has a Dr's appointment on Monday.  Pt called the nurse hotline and was instructed to come in to be evaluated.  Pt denies srom, bleeding, contractions, or pih symptoms.  + fm per pt.

## 2013-06-16 NOTE — MAU Note (Signed)
Pt presents with complaints of nasal congestion and burning in her chest. She has tried several over the counter medications and it hasn't really worked.

## 2013-06-16 NOTE — MAU Provider Note (Signed)
Chief Complaint:  Nasal Congestion and burning in chest    First Provider Initiated Contact with Patient 06/16/13 1530      HPI: Lauren [name] is a 32 y.o. [id number] at 64w5dwho presents to maternity admissions reporting cough, nasal and chest congestion x24 hours and burning in chest after coughing last night and for a few hours this morning which has now resolved.  She called nurse call line and was told to come to MAU to be evaluated.  She reports good fetal movement, denies contractions, LOF, vaginal bleeding, vaginal itching/burning, urinary symptoms, h/a, dizziness, n/v, or fever/chills.    Past Medical History: Past Medical History  Diagnosis Date  . Kidney stone 2014  . Anxiety     no meds currently  . Postpartum depression     took medication for short time  . Diabetes mellitus without complication     Gestational  . Anemia     Past obstetric history: OB History  Gravida Para Term Preterm AB SAB TAB Ectopic Multiple Living  3 2 1 1      2     # Outcome Date GA Lbr Len/2nd Weight Sex Delivery Anes PTL Lv  3 CUR           2 TRM      SVD   Y  1 PRE  [redacted]w[redacted]d   F SVD   Y      Past Surgical History: Past Surgical History  Procedure Laterality Date  . Lithotripsy      Family History: Family History  Problem Relation Age of Onset  . Hypertension Mother   . Hyperlipidemia Mother   . Diabetes Mother   . Heart disease Father   . Heart disease Paternal Grandfather     Social History: History  Substance Use Topics  . Smoking status: Never Smoker   . Smokeless tobacco: Not on file  . Alcohol Use: No    Allergies: No Known Allergies  Meds:  Prescriptions prior to admission  Medication Sig Dispense Refill  . famotidine (PEPCID) 10 MG tablet Take 10 mg by mouth 2 (two) times daily as needed for heartburn.      . glyBURIDE (DIABETA) 2.5 MG tablet Take 2.5 mg by mouth at bedtime.      Marland Kitchen IRON-FOLIC ACID PO Take 1 tablet by mouth 2 (two) times daily.      Marland Kitchen  Phenylephrine-APAP-Guaifenesin (MUCINEX FAST-MAX COLD & SINUS PO) Take 20 mLs by mouth daily as needed (cold).      . Prenatal Vit-Fe Fumarate-FA (PRENATAL MULTIVITAMIN) TABS Take 1 tablet by mouth daily at 12 noon.        ROS: Pertinent findings in history of present illness.  Physical Exam  Blood pressure 125/81, pulse 93, temperature 98 F (36.7 C), temperature source Oral, resp. rate 16, height 5\' 5"  (1.651 m), weight 80.74 kg (178 lb). GENERAL: Well-developed, well-nourished female in no acute distress.  HEENT: normocephalic HEART: normal rate, rhythm, heart sounds RESP: normal effort, lung sounds clear and equal bilaterally ABDOMEN: Soft, non-tender, gravid appropriate for gestational age EXTREMITIES: Nontender, no edema NEURO: alert and oriented SPECULUM EXAM: Deferred     FHT:  Baseline 135 , moderate variability, accelerations present, no decelerations Contractions: None on toco or to palpation    Assessment: 1. Upper respiratory infection     Plan: Consult Dr Vincente Poli Discharge home Increase PO fluids May take Tylenol cold/sinus or continue Mucinex cold (same ingredients) Keep scheduled appointment on Monday Return to MAU as  needed     Medication List    ASK your doctor about these medications       famotidine 10 MG tablet  Commonly known as:  PEPCID  Take 10 mg by mouth 2 (two) times daily as needed for heartburn.     glyBURIDE 2.5 MG tablet  Commonly known as:  DIABETA  Take 2.5 mg by mouth at bedtime.     IRON-FOLIC ACID PO  Take 1 tablet by mouth 2 (two) times daily.     MUCINEX FAST-MAX COLD & SINUS PO  Take 20 mLs by mouth daily as needed (cold).     prenatal multivitamin Tabs tablet  Take 1 tablet by mouth daily at 12 noon.        Sharen Counter Certified Nurse-Midwife 06/16/2013 3:42 PM

## 2013-06-18 LAB — URINE CULTURE: Colony Count: 9000

## 2013-07-02 ENCOUNTER — Encounter (HOSPITAL_COMMUNITY): Payer: Self-pay | Admitting: *Deleted

## 2013-07-02 ENCOUNTER — Telehealth (HOSPITAL_COMMUNITY): Payer: Self-pay | Admitting: *Deleted

## 2013-07-02 NOTE — Telephone Encounter (Signed)
Preadmission screen  

## 2013-07-05 ENCOUNTER — Inpatient Hospital Stay (HOSPITAL_COMMUNITY)
Admission: RE | Admit: 2013-07-05 | Discharge: 2013-07-08 | DRG: 775 | Disposition: A | Discharge status: Home / Self Care | Payer: 59 | Source: Ambulatory Visit | Attending: Obstetrics and Gynecology | Admitting: Obstetrics and Gynecology

## 2013-07-05 ENCOUNTER — Encounter (HOSPITAL_COMMUNITY): Payer: Self-pay

## 2013-07-05 DIAGNOSIS — O99814 Abnormal glucose complicating childbirth: Principal | ICD-10-CM | POA: Diagnosis present

## 2013-07-05 LAB — CBC
HCT: 31.4 % — ABNORMAL LOW (ref 36.0–46.0)
MCHC: 34.1 g/dL (ref 30.0–36.0)
MCV: 91.5 fL (ref 78.0–100.0)
Platelets: 185 10*3/uL (ref 150–400)
RBC: 3.43 MIL/uL — ABNORMAL LOW (ref 3.87–5.11)
RDW: 14.7 % (ref 11.5–15.5)

## 2013-07-05 MED ORDER — MISOPROSTOL 25 MCG QUARTER TABLET
25.0000 ug | ORAL_TABLET | ORAL | Status: AC | PRN
  Administered 2013-07-05 – 2013-07-06 (×2): 25 ug via VAGINAL
  Filled 2013-07-05 (×2): qty 0.25

## 2013-07-05 MED ORDER — TERBUTALINE SULFATE 1 MG/ML IJ SOLN: 0.2500 mg | Freq: Once | INTRAMUSCULAR | Status: AC | PRN

## 2013-07-05 MED ORDER — OXYCODONE-ACETAMINOPHEN 5-325 MG PO TABS: 1.0000 | ORAL_TABLET | ORAL | Status: DC | PRN

## 2013-07-05 MED ORDER — LIDOCAINE HCL (PF) 1 % IJ SOLN: 30.0000 mL | INTRAMUSCULAR | Status: DC | PRN

## 2013-07-05 MED ORDER — LACTATED RINGERS IV SOLN
INTRAVENOUS | Status: DC
  Administered 2013-07-05 – 2013-07-06 (×3): via INTRAVENOUS

## 2013-07-05 MED ORDER — ACETAMINOPHEN 325 MG PO TABS: 650.0000 mg | ORAL_TABLET | ORAL | Status: DC | PRN

## 2013-07-05 MED ORDER — BUTORPHANOL TARTRATE 1 MG/ML IJ SOLN
1.0000 mg | INTRAMUSCULAR | Status: DC | PRN
  Administered 2013-07-06: 1 mg via INTRAVENOUS
  Filled 2013-07-05: qty 1

## 2013-07-05 MED ORDER — LACTATED RINGERS IV SOLN
500.0000 mL | INTRAVENOUS | Status: DC | PRN
  Administered 2013-07-06: 300 mL via INTRAVENOUS

## 2013-07-05 MED ORDER — OXYTOCIN 40 UNITS IN LACTATED RINGERS INFUSION - SIMPLE MED
62.5000 mL/h | INTRAVENOUS | Status: DC
  Filled 2013-07-05: qty 1000

## 2013-07-05 MED ORDER — OXYTOCIN 40 UNITS IN LACTATED RINGERS INFUSION - SIMPLE MED
1.0000 m[IU]/min | INTRAVENOUS | Status: DC
  Administered 2013-07-06: 4 m[IU]/min via INTRAVENOUS
  Administered 2013-07-06: 3 m[IU]/min via INTRAVENOUS
  Administered 2013-07-06: 2 m[IU]/min via INTRAVENOUS
  Administered 2013-07-06: 6 m[IU]/min via INTRAVENOUS

## 2013-07-05 MED ORDER — ONDANSETRON HCL 4 MG/2ML IJ SOLN: 4.0000 mg | Freq: Four times a day (QID) | INTRAMUSCULAR | Status: DC | PRN

## 2013-07-05 MED ORDER — OXYTOCIN BOLUS FROM INFUSION: 500.0000 mL | INTRAVENOUS | Status: DC

## 2013-07-05 MED ORDER — IBUPROFEN 600 MG PO TABS: 600.0000 mg | ORAL_TABLET | Freq: Four times a day (QID) | ORAL | Status: DC | PRN

## 2013-07-05 MED ORDER — CITRIC ACID-SODIUM CITRATE 334-500 MG/5ML PO SOLN
30.0000 mL | ORAL | Status: DC | PRN
  Administered 2013-07-05: 30 mL via ORAL
  Filled 2013-07-05: qty 15

## 2013-07-05 MED ORDER — ZOLPIDEM TARTRATE 5 MG PO TABS
5.0000 mg | ORAL_TABLET | Freq: Every evening | ORAL | Status: DC | PRN
  Administered 2013-07-06: 5 mg via ORAL
  Filled 2013-07-05: qty 1

## 2013-07-06 ENCOUNTER — Encounter (HOSPITAL_COMMUNITY): Payer: 59 | Admitting: Anesthesiology

## 2013-07-06 ENCOUNTER — Inpatient Hospital Stay (HOSPITAL_COMMUNITY): Payer: 59 | Admitting: Anesthesiology

## 2013-07-06 ENCOUNTER — Encounter (HOSPITAL_COMMUNITY): Payer: Self-pay

## 2013-07-06 LAB — GLUCOSE, CAPILLARY: Glucose-Capillary: 70 mg/dL (ref 70–99)

## 2013-07-06 LAB — SYPHILIS: RPR W/REFLEX TO RPR TITER AND TREPONEMAL ANTIBODIES, TRADITIONAL SCREENING AND DIAGNOSIS ALGORITHM: RPR Ser Ql: NONREACTIVE

## 2013-07-06 MED ORDER — LANOLIN HYDROUS EX OINT: TOPICAL_OINTMENT | CUTANEOUS | Status: DC | PRN

## 2013-07-06 MED ORDER — DIPHENHYDRAMINE HCL 25 MG PO CAPS: 25.0000 mg | ORAL_CAPSULE | Freq: Four times a day (QID) | ORAL | Status: DC | PRN

## 2013-07-06 MED ORDER — OXYCODONE-ACETAMINOPHEN 5-325 MG PO TABS
1.0000 | ORAL_TABLET | ORAL | Status: DC | PRN
  Administered 2013-07-06 – 2013-07-07 (×4): 1 via ORAL
  Filled 2013-07-06 (×4): qty 1

## 2013-07-06 MED ORDER — FENTANYL 2.5 MCG/ML BUPIVACAINE 1/10 % EPIDURAL INFUSION (WH - ANES)
14.0000 mL/h | INTRAMUSCULAR | Status: DC | PRN
  Administered 2013-07-06: 14 mL/h via EPIDURAL
  Filled 2013-07-06 (×2): qty 125

## 2013-07-06 MED ORDER — BENZOCAINE-MENTHOL 20-0.5 % EX AERO
1.0000 "application " | INHALATION_SPRAY | CUTANEOUS | Status: DC | PRN
  Administered 2013-07-06: 1 via TOPICAL
  Filled 2013-07-06: qty 56

## 2013-07-06 MED ORDER — MEASLES, MUMPS & RUBELLA VAC ~~LOC~~ INJ: 0.5000 mL | INJECTION | Freq: Once | SUBCUTANEOUS | Status: DC

## 2013-07-06 MED ORDER — IBUPROFEN 600 MG PO TABS
600.0000 mg | ORAL_TABLET | Freq: Four times a day (QID) | ORAL | Status: DC
  Administered 2013-07-06 – 2013-07-08 (×9): 600 mg via ORAL
  Filled 2013-07-06 (×10): qty 1

## 2013-07-06 MED ORDER — SENNOSIDES-DOCUSATE SODIUM 8.6-50 MG PO TABS
2.0000 | ORAL_TABLET | ORAL | Status: DC
  Filled 2013-07-06 (×2): qty 2

## 2013-07-06 MED ORDER — TETANUS-DIPHTH-ACELL PERTUSSIS 5-2.5-18.5 LF-MCG/0.5 IM SUSP: 0.5000 mL | Freq: Once | INTRAMUSCULAR | Status: DC

## 2013-07-06 MED ORDER — WITCH HAZEL-GLYCERIN EX PADS: 1.0000 "application " | MEDICATED_PAD | CUTANEOUS | Status: DC | PRN

## 2013-07-06 MED ORDER — BUPIVACAINE HCL (PF) 0.25 % IJ SOLN
INTRAMUSCULAR | Status: DC | PRN
  Administered 2013-07-06: 3 mL via EPIDURAL

## 2013-07-06 MED ORDER — SODIUM BICARBONATE 8.4 % IV SOLN
INTRAVENOUS | Status: DC | PRN
  Administered 2013-07-06: 4 mL via EPIDURAL
  Administered 2013-07-06: 5 mL via EPIDURAL

## 2013-07-06 MED ORDER — MEDROXYPROGESTERONE ACETATE 150 MG/ML IM SUSP: 150.0000 mg | INTRAMUSCULAR | Status: DC | PRN

## 2013-07-06 MED ORDER — TERBUTALINE SULFATE 1 MG/ML IJ SOLN
INTRAMUSCULAR | Status: AC
  Administered 2013-07-06: 1 mg
  Filled 2013-07-06: qty 1

## 2013-07-06 MED ORDER — DIPHENHYDRAMINE HCL 50 MG/ML IJ SOLN: 12.5000 mg | INTRAMUSCULAR | Status: DC | PRN

## 2013-07-06 MED ORDER — SIMETHICONE 80 MG PO CHEW: 80.0000 mg | CHEWABLE_TABLET | ORAL | Status: DC | PRN

## 2013-07-06 MED ORDER — PHENYLEPHRINE 40 MCG/ML (10ML) SYRINGE FOR IV PUSH (FOR BLOOD PRESSURE SUPPORT): 80.0000 ug | PREFILLED_SYRINGE | INTRAVENOUS | Status: DC | PRN

## 2013-07-06 MED ORDER — EPHEDRINE 5 MG/ML INJ
10.0000 mg | INTRAVENOUS | Status: DC | PRN
  Filled 2013-07-06 (×2): qty 4

## 2013-07-06 MED ORDER — ONDANSETRON HCL 4 MG/2ML IJ SOLN: 4.0000 mg | INTRAMUSCULAR | Status: DC | PRN

## 2013-07-06 MED ORDER — TERBUTALINE SULFATE 1 MG/ML IJ SOLN: 0.2500 mg | Freq: Once | INTRAMUSCULAR | Status: DC

## 2013-07-06 MED ORDER — LACTATED RINGERS IV SOLN
500.0000 mL | Freq: Once | INTRAVENOUS | Status: AC
  Administered 2013-07-06: 500 mL via INTRAVENOUS

## 2013-07-06 MED ORDER — PHENYLEPHRINE 40 MCG/ML (10ML) SYRINGE FOR IV PUSH (FOR BLOOD PRESSURE SUPPORT)
80.0000 ug | PREFILLED_SYRINGE | INTRAVENOUS | Status: DC | PRN
  Filled 2013-07-06 (×2): qty 5

## 2013-07-06 MED ORDER — PRENATAL MULTIVITAMIN CH
1.0000 | ORAL_TABLET | Freq: Every day | ORAL | Status: DC
Start: 2013-07-07 — End: 2013-07-08
  Administered 2013-07-07 – 2013-07-08 (×2): 1 via ORAL
  Filled 2013-07-06 (×2): qty 1

## 2013-07-06 MED ORDER — ZOLPIDEM TARTRATE 5 MG PO TABS: 5.0000 mg | ORAL_TABLET | Freq: Every evening | ORAL | Status: DC | PRN

## 2013-07-06 MED ORDER — ONDANSETRON HCL 4 MG PO TABS: 4.0000 mg | ORAL_TABLET | ORAL | Status: DC | PRN

## 2013-07-06 MED ORDER — DIBUCAINE 1 % RE OINT: 1.0000 "application " | TOPICAL_OINTMENT | RECTAL | Status: DC | PRN

## 2013-07-06 MED ORDER — EPHEDRINE 5 MG/ML INJ: 10.0000 mg | INTRAVENOUS | Status: DC | PRN

## 2013-07-06 NOTE — Progress Notes (Signed)
efm d/cd per anesthesia  

## 2013-07-06 NOTE — H&P (Signed)
Lauren [name] is a 32 y.o. female presenting for Induction due to patient request and Gestational diabetes controlled on glyburide.  [place] 2 weeks ago shows EFW 83 % so EFW at this time is about 8 1/2 lbs.  . History OB History   Grav Para Term Preterm Abortions TAB SAB Ect Mult Living   3 2 1 1      2      Past Medical History  Diagnosis Date  . Kidney stone 2014  . Anxiety     no meds currently  . Postpartum depression     took medication for short time  . Diabetes mellitus without complication     Gestational  . Anemia   . Hx of preterm delivery, currently pregnant   . Hx of varicella    Past Surgical History  Procedure Laterality Date  . Lithotripsy     Family History: family history includes Cancer in her paternal grandmother; Diabetes in her mother; Heart disease in her father, maternal grandfather, paternal grandfather, and paternal grandmother; Hyperlipidemia in her mother; Hypertension in her mother; Mental retardation in her maternal aunt. Social History:  reports that she has never smoked. She does not have any smokeless tobacco history on file. She reports that she does not drink alcohol or use illicit drugs.   Prenatal Transfer Tool  Maternal Diabetes: Yes:  Diabetes Type:  Insulin/Medication controlled  Gestational diabetes Genetic Screening: Normal Maternal Ultrasounds/Referrals: Normal Fetal Ultrasounds or other Referrals:  None Maternal Substance Abuse:  No Significant Maternal Medications:  None Significant Maternal Lab Results:  None Other Comments:  None  ROS  2/50/-2 AROM clear Blood pressure 147/83, pulse 76, temperature 98.1 F (36.7 C), temperature source Oral, resp. rate 20, height 5\' 5"  (1.651 m), weight 83.462 kg (184 lb), SpO2 98.00%. Exam Physical Exam  Prenatal labs: ABO, Rh: A/Positive/-- (03/24 0000) Antibody: Negative (03/24 0000) Rubella: Immune (03/24 0000) RPR: NON REACTIVE (10/16 2050)  HBsAg: Negative (03/24 0000)  HIV:  Non-reactive (03/24 0000)  GBS: Negative (09/17 0000)   Assessment/Plan: IUP at term A2DM - glc monitor in labor.  Good control on meds Pitocin now, Cytotec last HS Anticipate SVD   Azelea Seguin C 07/06/2013, 8:56 AM

## 2013-07-06 NOTE — Anesthesia Preprocedure Evaluation (Signed)
Anesthesia Evaluation  Patient identified by MRN, date of birth, ID band Patient awake    Reviewed: Allergy & Precautions, H&P , Patient's Chart, lab work & pertinent test results  Airway Mallampati: II TM Distance: >3 FB Neck ROM: full    Dental  (+) Teeth Intact   Pulmonary  breath sounds clear to auscultation        Cardiovascular Rhythm:regular Rate:Normal     Neuro/Psych    GI/Hepatic   Endo/Other  diabetes, Gestational  Renal/GU      Musculoskeletal   Abdominal   Peds  Hematology   Anesthesia Other Findings       Reproductive/Obstetrics (+) Pregnancy                           Anesthesia Physical Anesthesia Plan  ASA: II  Anesthesia Plan: Epidural   Post-op Pain Management:    Induction:   Airway Management Planned:   Additional Equipment:   Intra-op Plan:   Post-operative Plan:   Informed Consent: I have reviewed the patients History and Physical, chart, labs and discussed the procedure including the risks, benefits and alternatives for the proposed anesthesia with the patient or authorized representative who has indicated his/her understanding and acceptance.   Dental Advisory Given  Plan Discussed with:   Anesthesia Plan Comments: (Labs checked- platelets confirmed with RN in room. Fetal heart tracing, per RN, reported to be stable enough for sitting procedure. Discussed epidural, and patient consents to the procedure:  included risk of possible headache,backache, failed block, allergic reaction, and nerve injury. This patient was asked if she had any questions or concerns before the procedure started.)        Anesthesia Quick Evaluation  

## 2013-07-06 NOTE — Anesthesia Procedure Notes (Signed)

## 2013-07-07 LAB — CBC
HCT: 29.1 % — ABNORMAL LOW (ref 36.0–46.0)
Hemoglobin: 10.1 g/dL — ABNORMAL LOW (ref 12.0–15.0)
MCH: 31.3 pg (ref 26.0–34.0)
MCHC: 34.7 g/dL (ref 30.0–36.0)
MCV: 90.1 fL (ref 78.0–100.0)
Platelets: 159 10*3/uL (ref 150–400)
RBC: 3.23 MIL/uL — ABNORMAL LOW (ref 3.87–5.11)

## 2013-07-07 NOTE — Progress Notes (Signed)
Post Partum Day 1 Subjective: no complaints, up ad lib, voiding, tolerating PO, + flatus and baby in NICU due to low sugars  Objective: Blood pressure 142/95, pulse 104, temperature 98.7 F (37.1 [name]), temperature source Oral, resp. rate 20, height 5\' 5"  (1.651 m), weight 83.462 kg (184 lb), SpO2 98.00%, unknown if currently breastfeeding.  Physical Exam:  General: alert, cooperative, appears stated age and no distress Lochia: appropriate Uterine Fundus: firm Incision: healing well DVT Evaluation: No evidence of DVT seen on physical exam.   Recent Labs  07/05/13 2050 07/07/13 0600  HGB 10.7* 10.1*  HCT 31.4* 29.1*    Assessment/Plan: Plan for discharge tomorrow and Breastfeeding   LOS: 2 days   Lauren [name] 07/07/2013, 9:33 AM

## 2013-07-07 NOTE — Anesthesia Postprocedure Evaluation (Signed)
Anesthesia Post Note  Patient: Lauren [name]  Procedure(s) Performed: * No procedures listed *  Anesthesia type: Epidural  Patient location: Mother/Baby  Post pain: Pain level controlled  Post assessment: Post-op Vital signs reviewed  Last Vitals:  Filed Vitals:   07/07/13 0653  BP: 142/95  Pulse: 104  Temp: 37.1 C  Resp: 20    Post vital signs: Reviewed  Level of consciousness:alert  Complications: No apparent anesthesia complications Anesthesia Post Note  Patient: Lauren [name]  Procedure(s) Performed: * No procedures listed *  Anesthesia type: Epidural  Patient location: Mother/Baby  Post pain: Pain level controlled  Post assessment: Post-op Vital signs reviewed  Last Vitals:  Filed Vitals:   07/07/13 0653  BP: 142/95  Pulse: 104  Temp: 37.1 C  Resp: 20    Post vital signs: Reviewed  Level of consciousness:alert  Complications: No apparent anesthesia complications

## 2013-07-08 MED ORDER — IBUPROFEN 600 MG PO TABS: 600.0000 mg | ORAL_TABLET | Freq: Four times a day (QID) | ORAL | Status: DC

## 2013-07-08 NOTE — Discharge Summary (Signed)
Obstetric Discharge Summary Reason for Admission: induction of labor Prenatal Procedures: NST and ultrasound Intrapartum Procedures: spontaneous vaginal delivery Postpartum Procedures: none Complications-Operative and Postpartum: none Hemoglobin  Date Value Range Status  07/07/2013 10.1* 12.0 - 15.0 g/dL Final     HCT  Date Value Range Status  07/07/2013 29.1* 36.0 - 46.0 % Final    Physical Exam:  General: alert, cooperative, appears stated age and no distress Lochia: appropriate Uterine Fundus: firm Incision: healing well DVT Evaluation: No evidence of DVT seen on physical exam.  Discharge Diagnoses: Term Pregnancy-delivered  Discharge Information: Date: 07/08/2013 Activity: pelvic rest Diet: routine Medications: Ibuprofen Condition: stable Instructions: refer to practice specific booklet Discharge to: home   Newborn Data: Live born female  Birth Weight: 9 lb 5.9 oz (4250 g) APGAR: 8, 8  Home with baby in NICU for low Glucose levels.  Lyal Husted C 07/08/2013, 9:48 AM

## 2013-07-08 NOTE — Lactation Note (Signed)
This note was copied from the chart of Lauren [name]. Lactation Consultation Note    Initial consult  with this mom of a term baby in the NICU for hypoglycemia. Mom is an experienced breast feeder, and has been able to breast feed her baby in the NICU, but he is the fed formula pc. Mom has not been pumping.     I advised mom to pump after breast feeding her baby, to both protect her supply, and provide supplemtn for the baby. With hand expression, mom has lots of milk. She reports not getting much with pumping. I gave mom the paper work to rent a DEP on her discharged tomorrow. Mom knows to call for questions/concenrs.  Patient Name: Lauren Urvi Imes [id number] Date: 07/08/2013 Reason for consult: Follow-up assessment;NICU baby   Maternal Data    Feeding    LATCH Score/Interventions                      Lactation Tools Discussed/Used Tools: Pump Breast pump type: Double-Electric Breast Pump   Consult Status Consult Status: Complete    Alfred Levins 07/08/2013, 5:18 PM

## 2013-07-08 NOTE — Progress Notes (Signed)
Clinical Social Work Department PSYCHOSOCIAL ASSESSMENT - MATERNAL/CHILD 07/08/2013  Patient:  Lauren [name],Lauren [name]  Account Number:  [id number]  Admit Date:  07/05/2013  Childs Name:   Quentin Stmarie    Clinical Social Worker:  Santiana Glidden, LCSW   Date/Time:  07/08/2013 11:45 AM  Date Referred:  07/08/2013   Referral source  CSW     Referred reason  NICU   Other referral source:    I:  FAMILY / HOME ENVIRONMENT Child's legal guardian:  PARENT  Guardian - Name Guardian - Age Guardian - Address  Lauren [name],Lauren [name] 31 [address]  [place], [place] [address]  Lauren [name], Lauren [name]  same as above   Other household support members/support persons Other support:    II  PSYCHOSOCIAL DATA Information Source:  Patient Interview  Financial and Community Resources Employment:   Spouse is employed   Financial resources:  Private Insurance If Medicaid - County:    School / Grade:   Maternity Care Coordinator / Child Services Coordination / Early Interventions:  Cultural issues impacting care:   None reported    III  STRENGTHS Strengths  Supportive family/friends  Understanding of illness  Home prepared for Child (including basic supplies)  Adequate Resources   Strength comment:    IV  RISK FACTORS AND CURRENT PROBLEMS Current Problem:       V  SOCIAL WORK ASSESSMENT Met with both parents.  They were pleasant and receptive to social work intervention.  Parents are married.  They have two other dependents ages 12 and 4.     Both parents seems to be coping well with newborn's NICU admission.  Informed that they have spoken with the medical team and hopes that newborn will not require a lengthy stay.   Mother states that she did not have Gestational Diabetes with the other pregnancies.  She denies any hx of substance abuse.  Informed that she suffered from PP Depression after the birth of her first child.  She was reportedly prescribed medication which she took for about two weeks and the  symptoms quickly resolved.  She denies any current symptoms of anxiety or depression.    No acute social concerns related at this time.   Parents informed of social work availability.    VI SOCIAL WORK PLAN Social Work Plan  No Further Intervention Required / No Barriers to Discharge   Velna Hedgecock J, LCSW  

## 2013-07-08 NOTE — Lactation Note (Signed)
This note was copied from the chart of Lauren Mammie Theisen. Lactation Consultation Note  Baby is in NICU mom is using DEBP.  She reports not getting as much with recent pumping every 3-4 hours.  Encouraged hand massage prior and post pumping with hand expression.  Encouraged to pump every 2-3 hours and to relax about pumping.  Set up at this time with baby blanket and visible milk expressing.  Mom reports breast are more full and warm.  Discussed engorgement.  Mom will have pump at home and encouraged to let us check the suction for her. Mom to be discharged this evening.   Patient Name: Lauren [name] [id number] Date: 07/08/2013 Reason for consult: Follow-up assessment;NICU baby   Maternal Data    Feeding    LATCH Score/Interventions                      Lactation Tools Discussed/Used Tools: Pump Breast pump type: Double-Electric Breast Pump   Consult Status Consult Status: Complete    Shoptaw, Arvella Merles 07/08/2013, 4:56 PM

## 2013-07-11 ENCOUNTER — Ambulatory Visit: Payer: Self-pay

## 2013-07-11 NOTE — Lactation Note (Signed)
This note was copied from the chart of Lauren Kinlie Staszak. Lactation Consultation Note   Follow up consult with this mom of a NICU baby, full term, has been bottle feeding, and is now ad lib demand. Mom needed help with getting her baby to maintain a latch. Mom was using cradle hold, and I had her switch to cross cradle, and obtain a deeper latch. Baby suckled for a few minutes, and then unlatched again. With a 24 nipple shield, he maintained latch well, but only transferred a total of 18 mls from both breasts. He was tired and not interested in formula by bottle after. I shoewd mom how to apply the nipple shield, and care for it.  I will work with mom again with the next feeding. Mom is using DEP after this feeding, and will pre-pump if needed before the next feed. This may make latching and letdown easier.   Patient Name: Lauren [name] [id number] Date: 07/11/2013 Reason for consult: Follow-up assessment;NICU baby   Maternal Data    Feeding Feeding Type: Breast Fed Length of feed: 60 min  LATCH Score/Interventions Latch: Repeated attempts needed to sustain latch, nipple held in mouth throughout feeding, stimulation needed to elicit sucking reflex. (24 NS used to maintain latch - baby used to bottle nipple) Intervention(s): Skin to skin;Waking techniques Intervention(s): Adjust position;Assist with latch;Breast massage;Breast compression  Audible Swallowing: A few with stimulation  Type of Nipple: Everted at rest and after stimulation  Comfort (Breast/Nipple): Filling, red/small blisters or bruises, mild/mod discomfort  Problem noted: Filling (mom very full, firm)  Hold (Positioning): Assistance needed to correctly position infant at breast and maintain latch. (showed mom how to cross cradle , as opposed to cradle) Intervention(s): Breastfeeding basics reviewed;Support Pillows;Position options;Skin to skin  LATCH Score: 6  Lactation Tools Discussed/Used     Consult  Status Consult Status: Follow-up Follow-up type:  (prn in NICU)    Alfred Levins 07/11/2013, 2:07 PM

## 2013-07-17 ENCOUNTER — Ambulatory Visit (INDEPENDENT_AMBULATORY_CARE_PROVIDER_SITE_OTHER): Payer: 59 | Admitting: Family Medicine

## 2013-07-17 ENCOUNTER — Encounter: Payer: Self-pay | Admitting: Family Medicine

## 2013-07-17 VITALS — BP 152/100 | Temp 98.8°F | Ht 65.0 in | Wt 156.0 lb

## 2013-07-17 DIAGNOSIS — I889 Nonspecific lymphadenitis, unspecified: Secondary | ICD-10-CM

## 2013-07-17 MED ORDER — AMOXICILLIN 500 MG PO CAPS: 500.0000 mg | ORAL_CAPSULE | Freq: Three times a day (TID) | ORAL | Status: DC

## 2013-07-17 NOTE — Progress Notes (Signed)
  Subjective:    Patient ID: Lauren [name], female    DOB: 1981-01-25, 32 y.o.   MRN: [id number]  HPIHaving a migraine, sore throat, and left ear pain, low grade fever and diarrhea. Started 4 days ago.  Achy at times somewhat diminished energy.  Frontal headache left-sided felt like a migraine at times did not take medication.     Review of Systems No vomiting no diarrhea no abdominal pain no change in bowel habits other than occasional loose stools    Objective:   Physical Exam  Alert HEENT slight nasal congestion pharynx erythematous neck tender anterior nodes lungs clear. Heart regular in rhythm. Blood pressure 134/94 on repeat      Assessment & Plan:  Impression lymphadenitis #2 elevated blood pressure plan antibiotics prescribed. A mocks 500 3 times a day 10 days. Tylenol when necessary for headache.

## 2014-07-01 ENCOUNTER — Encounter: Payer: Self-pay | Admitting: Family Medicine

## 2014-07-01 ENCOUNTER — Ambulatory Visit (INDEPENDENT_AMBULATORY_CARE_PROVIDER_SITE_OTHER): Payer: 59 | Admitting: Family Medicine

## 2014-07-01 VITALS — BP 142/96 | Temp 98.4°F | Ht 65.0 in | Wt 172.2 lb

## 2014-07-01 DIAGNOSIS — J329 Chronic sinusitis, unspecified: Secondary | ICD-10-CM

## 2014-07-01 MED ORDER — ALBUTEROL SULFATE HFA 108 (90 BASE) MCG/ACT IN AERS: 2.0000 | INHALATION_SPRAY | Freq: Four times a day (QID) | RESPIRATORY_TRACT | Status: DC | PRN

## 2014-07-01 MED ORDER — AMOXICILLIN 500 MG PO CAPS: 500.0000 mg | ORAL_CAPSULE | Freq: Three times a day (TID) | ORAL | Status: DC

## 2014-07-01 NOTE — Progress Notes (Signed)
   Subjective:    Patient ID: Lauren [name], female    DOB: 02-17-1981, 33 y.o.   MRN: [id number]  Sinusitis This is a new problem. The current episode started in the past 7 days. Associated symptoms include congestion and coughing. (Wheezing) Treatments tried: tylenol sinus.   Last tue started off with bad headache and pressure  Felt bad and then better  Last two d has gotten worsem  Now a lot of colored discharge in the nose  Headache  tyl sinus not helping as much now     Review of Systems  HENT: Positive for congestion.   Respiratory: Positive for cough.    No vomiting or diarrhea no rash    Objective:   Physical Exam  Alert moderate malaise. Blood pressure good on repeat. Vital stable. HEENT moderate his congestion frontal tenderness. Pharynx normal neck supple. Lungs clear. Heart regular in rhythm.      Assessment & Plan:  Impression rhinosinusitis plan antibiotics prescribed. Symptomatic care discussed. Warning signs discussed. WSL

## 2014-07-22 ENCOUNTER — Encounter: Payer: Self-pay | Admitting: Family Medicine

## 2014-07-31 ENCOUNTER — Encounter: Payer: Self-pay | Admitting: Nurse Practitioner

## 2014-07-31 ENCOUNTER — Ambulatory Visit (INDEPENDENT_AMBULATORY_CARE_PROVIDER_SITE_OTHER): Payer: 59 | Admitting: Nurse Practitioner

## 2014-07-31 VITALS — BP 122/86 | Temp 98.5°F | Ht 65.0 in | Wt 165.0 lb

## 2014-07-31 DIAGNOSIS — F418 Other specified anxiety disorders: Secondary | ICD-10-CM

## 2014-07-31 DIAGNOSIS — R319 Hematuria, unspecified: Secondary | ICD-10-CM

## 2014-07-31 LAB — POCT URINALYSIS DIPSTICK
Blood, UA: 250
SPEC GRAV UA: 1.015
pH, UA: 7

## 2014-07-31 LAB — POCT UA - MICROSCOPIC ONLY
BACTERIA, U MICROSCOPIC: NEGATIVE
Epithelial cells, urine per micros: NEGATIVE
WBC, Ur, HPF, POC: 0

## 2014-07-31 MED ORDER — ESCITALOPRAM OXALATE 10 MG PO TABS: ORAL_TABLET | ORAL | Status: DC

## 2014-07-31 MED ORDER — CEFPROZIL 500 MG PO TABS: 500.0000 mg | ORAL_TABLET | Freq: Two times a day (BID) | ORAL | Status: DC

## 2014-07-31 NOTE — Progress Notes (Signed)
   Subjective:    Patient ID: Lauren [name], female    DOB: 1981/02/02, 33 y.o.   MRN: [id number]  Hematuria This is a new problem. The current episode started in the past 7 days. Associated symptoms include abdominal pain and flank pain. Her past medical history is significant for kidney stones.      Review of Systems  Gastrointestinal: Positive for abdominal pain.  Genitourinary: Positive for hematuria and flank pain.   Urinary frequency, no change. No dysuria. No fever. No nausea vomiting. minimal Left-sided CVA and flank tenderness. Married, same sexual partner. No vaginal discharge. Breast-feeding only at night. Has been experiencing some depression and anxiety. Denies any significant postpartum depression. Having significant marital issues, is doing individual and couples counseling. Denies any suicidal or homicidal thoughts or ideation.    Objective:   Physical Exam NAD. Alert, oriented. Lungs clear. Heart regular rate rhythm. Mild left CVA/flank tenderness. Abdomen soft nondistended nontender. Results for orders placed or performed in visit on 07/31/14  POCT urinalysis dipstick  Result Value Ref Range   Color, UA     Clarity, UA     Glucose, UA     Bilirubin, UA     Ketones, UA     Spec Grav, UA 1.015    Blood, UA 250    pH, UA 7.0    Protein, UA     Urobilinogen, UA     Nitrite, UA     Leukocytes, UA    POCT UA - Microscopic Only  Result Value Ref Range   WBC, Ur, HPF, POC 0    RBC, urine, microscopic TNTC    Bacteria, U Microscopic neg    Mucus, UA     Epithelial cells, urine per micros neg    Crystals, Ur, HPF, POC     Casts, Ur, LPF, POC     Yeast, UA            Assessment & Plan:  Hematuria - Plan: POCT urinalysis dipstick, Urine culture, POCT UA - Microscopic Only  Depression with anxiety  Meds ordered this encounter  Medications  . UNKNOWN TO PATIENT    Sig: Birth control tablet  . cefPROZIL (CEFZIL) 500 MG tablet    Sig: Take 1 tablet (500  mg total) by mouth 2 (two) times daily.    Dispense:  14 tablet    Refill:  0    Order Specific Question:  Supervising Provider    Answer:  Merlyn AlbertLUKING, WILLIAM S [[id number]]  . escitalopram (LEXAPRO) 10 MG tablet    Sig: One po q am    Dispense:  30 tablet    Refill:  2    Order Specific Question:  Supervising Provider    Answer:  Riccardo DubinLUKING, WILLIAM S [[id number]]   Will cover with antibiotics as a precaution. Urine culture pending. Continue counseling. DC Lexapro and call if any adverse effects. Call or go to ED if any signs of kidney stone. Return in about 1 month (around 08/30/2014).

## 2014-08-01 ENCOUNTER — Encounter: Payer: Self-pay | Admitting: Nurse Practitioner

## 2014-08-01 DIAGNOSIS — F418 Other specified anxiety disorders: Secondary | ICD-10-CM | POA: Insufficient documentation

## 2014-08-02 LAB — URINE CULTURE: Colony Count: 9000

## 2014-08-05 ENCOUNTER — Telehealth: Payer: Self-pay

## 2014-08-05 NOTE — Telephone Encounter (Signed)
[facility] (Per Eber Jonesarolyn[facility] No infection on culture; get back with urology if blood in urine continues; call if signs of kidney stone.

## 2014-08-06 NOTE — Telephone Encounter (Signed)
Notified patient no infection on culture; get back with urology if blood in urine continues; call if signs of kidney stone. Patient verbalized understanding. Patient states she has stopped taking the Lexapro because it made her feel bad, dizzy and tired all the time. She wanted to know if something else could be prescribed with less side effects.

## 2014-08-09 ENCOUNTER — Other Ambulatory Visit: Payer: Self-pay | Admitting: Nurse Practitioner

## 2014-08-09 MED ORDER — SERTRALINE HCL 50 MG PO TABS: ORAL_TABLET | ORAL | Status: DC

## 2014-08-09 NOTE — Telephone Encounter (Signed)
I recommend trying Zoloft; will start with lowest dose and slowly titrate up; this has good research indicating safe with breastfeeding; again DC med and call if any significant side efffects

## 2014-08-09 NOTE — Telephone Encounter (Signed)
Left message on voicemail to return call.

## 2014-08-12 NOTE — Telephone Encounter (Signed)
Discussed with patient

## 2014-08-13 ENCOUNTER — Ambulatory Visit (INDEPENDENT_AMBULATORY_CARE_PROVIDER_SITE_OTHER): Payer: 59 | Admitting: Family Medicine

## 2014-08-13 ENCOUNTER — Encounter: Payer: Self-pay | Admitting: Family Medicine

## 2014-08-13 VITALS — BP 110/80 | Temp 98.8°F | Ht 65.0 in | Wt 164.1 lb

## 2014-08-13 DIAGNOSIS — J329 Chronic sinusitis, unspecified: Secondary | ICD-10-CM

## 2014-08-13 MED ORDER — CLARITHROMYCIN 500 MG PO TABS: 500.0000 mg | ORAL_TABLET | Freq: Two times a day (BID) | ORAL | Status: DC

## 2014-08-13 NOTE — Progress Notes (Signed)
   Subjective:    Patient ID: Lauren [name], female    DOB: August 07, 1981, 33 y.o.   MRN: [id number]  Sinusitis This is a new problem. The current episode started in the past 7 days. The problem is unchanged. There has been no fever. The pain is moderate. Associated symptoms include congestion, coughing, sinus pressure and sneezing. Past treatments include oral decongestants. The treatment provided no relief.   Patient states that she has no other concerns at this time.   Took abx for possible uti, cefzil  Diminished energy   No fever   Poss pressure frontal  tyl sinus  Maybe moving into the chest heard crackling with cough  Review of Systems  HENT: Positive for congestion, sinus pressure and sneezing.   Respiratory: Positive for cough.        Objective:   Physical Exam  Alert no apparent distress. Vitals stable. Lungs clear. Heart regular in rhythm. H&T moderate his congestion frontal tenderness. Intermittent bronchial cough during exam.      Assessment & Plan:  Impression post viral rhinosinusitis plan antibiotics prescribed. Symptomatic care discussed. Warning signs discussed. WSL

## 2014-08-29 ENCOUNTER — Ambulatory Visit: Payer: 59 | Admitting: Nurse Practitioner

## 2014-10-04 ENCOUNTER — Other Ambulatory Visit: Payer: Self-pay | Admitting: Nurse Practitioner

## 2014-10-04 ENCOUNTER — Telehealth: Payer: Self-pay | Admitting: Nurse Practitioner

## 2014-10-04 MED ORDER — SERTRALINE HCL 50 MG PO TABS: ORAL_TABLET | ORAL | Status: DC

## 2014-10-04 NOTE — Telephone Encounter (Signed)
Will send in another Rx. Needs office visit

## 2014-10-04 NOTE — Telephone Encounter (Signed)
Pt is requesting a refill on her sertraline. [facility] [place]

## 2014-10-04 NOTE — Telephone Encounter (Signed)
Patient notified and scheduled follow up office visit. 

## 2014-10-21 ENCOUNTER — Ambulatory Visit: Payer: Self-pay | Admitting: Nurse Practitioner

## 2015-12-31 ENCOUNTER — Encounter: Payer: Self-pay | Admitting: Family Medicine

## 2015-12-31 ENCOUNTER — Ambulatory Visit (INDEPENDENT_AMBULATORY_CARE_PROVIDER_SITE_OTHER): Payer: BLUE CROSS/BLUE SHIELD | Admitting: Family Medicine

## 2015-12-31 VITALS — BP 132/88 | Temp 98.1°F | Ht 65.0 in | Wt 147.0 lb

## 2015-12-31 DIAGNOSIS — J019 Acute sinusitis, unspecified: Secondary | ICD-10-CM | POA: Diagnosis not present

## 2015-12-31 MED ORDER — AMOXICILLIN 500 MG PO TABS: 500.0000 mg | ORAL_TABLET | Freq: Three times a day (TID) | ORAL | Status: DC

## 2015-12-31 NOTE — Progress Notes (Signed)
   Subjective:    Patient ID: Lauren [name], female    DOB: Apr 20, 1981, 35 y.o.   MRN: [id number]  Cough This is a new problem. Episode onset: 6 - 8 days. Associated symptoms include rhinorrhea and wheezing. Pertinent negatives include no chest pain, ear pain, fever or shortness of breath. Associated symptoms comments: Congestion . Treatments tried: tylenol sinus.   Viral like illness for multiple days and progressing and head congestion coughing wheezing nausea no vomiting   Review of Systems  Constitutional: Negative for fever and activity change.  HENT: Positive for congestion and rhinorrhea. Negative for ear pain.   Eyes: Negative for discharge.  Respiratory: Positive for cough and wheezing. Negative for shortness of breath.   Cardiovascular: Negative for chest pain.       Objective:   Physical Exam  Constitutional: She appears well-developed.  HENT:  Head: Normocephalic.  Nose: Nose normal.  Mouth/Throat: Oropharynx is clear and moist. No oropharyngeal exudate.  Neck: Neck supple.  Cardiovascular: Normal rate and normal heart sounds.   No murmur heard. Pulmonary/Chest: Effort normal and breath sounds normal. She has no wheezes.  Lymphadenopathy:    She has no cervical adenopathy.  Skin: Skin is warm and dry.  Nursing note and vitals reviewed.         Assessment & Plan:  Viral syndrome secondary rhinosinusitis antibiotic prescribed warning signs discussed follow-up

## 2016-10-29 ENCOUNTER — Encounter: Payer: Self-pay | Admitting: Nurse Practitioner

## 2016-10-29 ENCOUNTER — Ambulatory Visit (INDEPENDENT_AMBULATORY_CARE_PROVIDER_SITE_OTHER): Payer: BLUE CROSS/BLUE SHIELD | Admitting: Nurse Practitioner

## 2016-10-29 VITALS — BP 128/80 | Temp 98.8°F | Ht 64.5 in | Wt 161.0 lb

## 2016-10-29 DIAGNOSIS — M79674 Pain in right toe(s): Secondary | ICD-10-CM | POA: Diagnosis not present

## 2016-10-29 DIAGNOSIS — M79675 Pain in left toe(s): Secondary | ICD-10-CM | POA: Diagnosis not present

## 2016-10-29 MED ORDER — PREDNISONE 20 MG PO TABS: ORAL_TABLET | ORAL | 0 refills | Status: DC

## 2016-10-29 MED ORDER — CEPHALEXIN 500 MG PO CAPS: 500.0000 mg | ORAL_CAPSULE | Freq: Three times a day (TID) | ORAL | 0 refills | Status: DC

## 2016-10-29 NOTE — Progress Notes (Addendum)
Subjective:  36 yo female seen today for reports of pain and swelling to R.great toe.  This is a new problem for her.  She has a significant family history for gout on both maternal and paternal side.  Had sudden onset of pain and swelling to R.great toe last evening.  Pain was severe, 10/10 at its worst, 7/10 at this time and made worse with movement, touch, and pressure.  Used ice, elevation, and ibuprofen with moderate relief after several hours.  Diet includes wine several days/week and regular shellfish and red meat intake.  Has a history of kidney stones requiring lithotripsy.  Does mention recent cat scratch on lateral foot, but now healed.  Denies any fever or chills.    Objective:   BP 128/80   Temp 98.8 F (37.1 C) (Oral)   Ht 5' 4.5" (1.638 m)   Wt 161 lb (73 kg)   BMI 27.21 kg/m   Alert and oriented, NAD.  Well groomed and well nourished.  Musc: L.Foot dorsiflexion exam limited r/t pain, L.ankle FROM, severe tenderness to palpation of L. MCP and PIP.  Toe mildly erythematous and edematous, cap refill <3 sec, +2 DP pulse Skin: two healed scratches to Lateral side of L.foot, no redness or swelling      Assessment:   Pain of left great toe - Plan: Uric acid, Basic metabolic panel     Plan:   Meds ordered this encounter  Medications  . cephALEXin (KEFLEX) 500 MG capsule    Sig: Take 1 capsule (500 mg total) by mouth 3 (three) times daily.    Dispense:  21 capsule    Refill:  0    Order Specific Question:   Supervising Provider    Answer:   Merlyn AlbertLUKING, WILLIAM S [[id number]]  . predniSONE (DELTASONE) 20 MG tablet    Sig: 3 po qd x 3 d then 2 po qd x 3 d then 1 po qd x 2 d    Dispense:  17 tablet    Refill:  0    Order Specific Question:   Supervising Provider    Answer:   Merlyn AlbertLUKING, WILLIAM S [[id number]]     Symptom care and warning signs reviewed, steroid taper and OTC pain control discussed  Given information on low purine diet.  Rx Keflex provided for potential infection, warning signs  reviewed   Visit ED over the weekend for increased swelling, redness, or uncontrolled pain.   Call back in 72 hours if no improvement.   I have seen and examined this patient alongside the NP student and agree with the  assessment and plan.   Sherie Donarolyn Hoskins, FNP

## 2016-10-29 NOTE — Patient Instructions (Signed)
Gout  Gout Gout is painful swelling that can occur in some of your joints. Gout is a type of arthritis. This condition is caused by having too much uric acid in your body. Uric acid is a chemical that forms when your body breaks down substances called purines. Purines are important for building body proteins. When your body has too much uric acid, sharp crystals can form and build up inside your joints. This causes pain and swelling. Gout attacks can happen quickly and be very painful (acute gout). Over time, the attacks can affect more joints and become more frequent (chronic gout). Gout can also cause uric acid to build up under your skin and inside your kidneys. What are the causes? This condition is caused by too much uric acid in your blood. This can occur because:  Your kidneys do not remove enough uric acid from your blood. This is the most common cause.  Your body makes too much uric acid. This can occur with some cancers and cancer treatments. It can also occur if your body is breaking down too many red blood cells (hemolytic anemia).  You eat too many foods that are high in purines. These foods include organ meats and some seafood. Alcohol, especially beer, is also high in purines. A gout attack may be triggered by trauma or stress. What increases the risk? This condition is more likely to develop in people who:  Have a family history of gout.  Are female and middle-aged.  Are female and have gone through menopause.  Are obese.  Frequently drink alcohol, especially beer.  Are dehydrated.  Lose weight too quickly.  Have an organ transplant.  Have lead poisoning.  Take certain medicines, including aspirin, cyclosporine, diuretics, levodopa, and niacin.  Have kidney disease or psoriasis. What are the signs or symptoms? An attack of acute gout happens quickly. It usually occurs in just one joint. The most common place is the big toe. Attacks often start at night. Other  joints that may be affected include joints of the feet, ankle, knee, fingers, wrist, or elbow. Symptoms may include:  Severe pain.  Warmth.  Swelling.  Stiffness.  Tenderness. The affected joint may be very painful to touch.  Shiny, red, or purple skin.  Chills and fever. Chronic gout may cause symptoms more frequently. More joints may be involved. You may also have white or yellow lumps (tophi) on your hands or feet or in other areas near your joints. How is this diagnosed? This condition is diagnosed based on your symptoms, medical history, and physical exam. You may have tests, such as:  Blood tests to measure uric acid levels.  Removal of joint fluid with a needle (aspiration) to look for uric acid crystals.  X-rays to look for joint damage. How is this treated? Treatment for this condition has two phases: treating an acute attack and preventing future attacks. Acute gout treatment may include medicines to reduce pain and swelling, including:  NSAIDs.  Steroids. These are strong anti-inflammatory medicines that can be taken by mouth (orally) or injected into a joint.  Colchicine. This medicine relieves pain and swelling when it is taken soon after an attack. It can be given orally or through an IV tube. Preventive treatment may include:  Daily use of smaller doses of NSAIDs or colchicine.  Use of a medicine that reduces uric acid levels in your blood.  Changes to your diet. You may need to see a specialist about healthy eating (dietitian). Follow these instructions  at home: During a Gout Attack  If directed, apply ice to the affected area:  Put ice in a plastic bag.  Place a towel between your skin and the bag.  Leave the ice on for 20 minutes, 2-3 times a day.  Rest the joint as much as possible. If the affected joint is in your leg, you may be given crutches to use.  Raise (elevate) the affected joint above the level of your heart as often as  possible.  Drink enough fluids to keep your urine clear or pale yellow.  Take over-the-counter and prescription medicines only as told by your health care provider.  Do not drive or operate heavy machinery while taking prescription pain medicine.  Follow instructions from your health care provider about eating or drinking restrictions.  Return to your normal activities as told by your health care provider. Ask your health care provider what activities are safe for you. Avoiding Future Gout Attacks  Follow a low-purine diet as told by your dietitian or health care provider. Avoid foods and drinks that are high in purines, including liver, kidney, anchovies, asparagus, herring, mushrooms, mussels, and beer.  Limit alcohol intake to no more than 1 drink a day for nonpregnant women and 2 drinks a day for men. One drink equals 12 oz of beer, 5 oz of wine, or 1 oz of hard liquor.  Maintain a healthy weight or lose weight if you are overweight. If you want to lose weight, talk with your health care provider. It is important that you do not lose weight too quickly.  Start or maintain an exercise program as told by your health care provider.  Drink enough fluids to keep your urine clear or pale yellow.  Take over-the-counter and prescription medicines only as told by your health care provider.  Keep all follow-up visits as told by your health care provider. This is important. Contact a health care provider if:  You have another gout attack.  You continue to have symptoms of a gout attack after10 days of treatment.  You have side effects from your medicines.  You have chills or a fever.  You have burning pain when you urinate.  You have pain in your lower back or belly. Get help right away if:  You have severe or uncontrolled pain.  You cannot urinate. This information is not intended to replace advice given to you by your health care provider. Make sure you discuss any questions  you have with your health care provider. Document Released: 09/03/2000 Document Revised: 02/12/2016 Document Reviewed: 06/19/2015 Elsevier Interactive Patient Education  2017 [employer]

## 2016-10-30 LAB — BASIC METABOLIC PANEL
BUN / CREAT RATIO: 12 (ref 9–23)
BUN: 12 mg/dL (ref 6–20)
CO2: 24 mmol/L (ref 18–29)
Calcium: 9.2 mg/dL (ref 8.7–10.2)
Chloride: 101 mmol/L (ref 96–106)
Creatinine, Ser: 1.01 mg/dL — ABNORMAL HIGH (ref 0.57–1.00)
GFR calc Af Amer: 83 mL/min/{1.73_m2} (ref >59)
GFR calc non Af Amer: 72 mL/min/{1.73_m2} (ref >59)
GLUCOSE: 81 mg/dL (ref 65–99)
Potassium: 4.5 mmol/L (ref 3.5–5.2)
Sodium: 141 mmol/L (ref 134–144)

## 2016-10-30 LAB — URIC ACID: Uric Acid: 5.4 mg/dL (ref 2.5–7.1)

## 2017-04-14 ENCOUNTER — Ambulatory Visit (INDEPENDENT_AMBULATORY_CARE_PROVIDER_SITE_OTHER): Payer: BLUE CROSS/BLUE SHIELD | Admitting: Nurse Practitioner

## 2017-04-14 ENCOUNTER — Encounter: Payer: Self-pay | Admitting: Nurse Practitioner

## 2017-04-14 VITALS — BP 110/78 | Temp 98.6°F | Ht 64.5 in | Wt 160.0 lb

## 2017-04-14 DIAGNOSIS — J069 Acute upper respiratory infection, unspecified: Secondary | ICD-10-CM

## 2017-04-14 DIAGNOSIS — B9689 Other specified bacterial agents as the cause of diseases classified elsewhere: Secondary | ICD-10-CM | POA: Diagnosis not present

## 2017-04-14 DIAGNOSIS — J45991 Cough variant asthma: Secondary | ICD-10-CM | POA: Diagnosis not present

## 2017-04-14 MED ORDER — AZITHROMYCIN 250 MG PO TABS: ORAL_TABLET | ORAL | 0 refills | Status: DC

## 2017-04-14 MED ORDER — ALBUTEROL SULFATE HFA 108 (90 BASE) MCG/ACT IN AERS: 2.0000 | INHALATION_SPRAY | RESPIRATORY_TRACT | 0 refills | Status: DC | PRN

## 2017-04-15 ENCOUNTER — Encounter: Payer: Self-pay | Admitting: Nurse Practitioner

## 2017-04-15 DIAGNOSIS — J45991 Cough variant asthma: Secondary | ICD-10-CM | POA: Insufficient documentation

## 2017-04-15 NOTE — Progress Notes (Signed)
Subjective:  Presents for complaints of sore throat and congestion for the past 2 days. No fever. Slight headache. Cough became productive this morning. Head congestion. No ear pain but pain behind both ears. Has been having some cough and slight chest tightness for several months. Mainly associated with heat humidity and certain environments. Nonsmoker. Unassociated with activity. No chest discomfort or ischemic type pain. Seems to be worse the past 1-2 days with illness. Was exposed to secondhand smoke in childhood. Cough can last from several hours to all day. Slight wheezing at times. No vomiting diarrhea or abdominal pain. No acid reflux or heartburn.  Objective:   BP 110/78   Temp 98.6 F (37 C) (Oral)   Ht 5' 4.5" (1.638 m)   Wt 160 lb (72.6 kg)   SpO2 95%   BMI 27.04 kg/m  NAD. Alert, oriented. TMs clear effusion, no erythema. Pharynx mildly erythematous with PND noted. Neck supple with mild soft anterior adenopathy. Lungs clear. Heart regular rate rhythm. No wheezing or tachypnea. Occasional slightly productive cough noted.  Assessment:   Problem List Items Addressed This Visit      Respiratory   Cough variant asthma   Relevant Medications   albuterol (PROVENTIL HFA;VENTOLIN HFA) 108 (90 Base) MCG/ACT inhaler    Other Visit Diagnoses    Bacterial upper respiratory infection    -  Primary   Relevant Medications   azithromycin (ZITHROMAX Z-PAK) 250 MG tablet       Plan:   Meds ordered this encounter  Medications  . azithromycin (ZITHROMAX Z-PAK) 250 MG tablet    Sig: Take 2 tablets (500 mg) on  Day 1,  followed by 1 tablet (250 mg) once daily on Days 2 through 5.    Dispense:  6 each    Refill:  0    Order Specific Question:   Supervising Provider    Answer:   Merlyn AlbertLUKING, WILLIAM S [[id number]]  . albuterol (PROVENTIL HFA;VENTOLIN HFA) 108 (90 Base) MCG/ACT inhaler    Sig: Inhale 2 puffs into the lungs every 4 (four) hours as needed.    Dispense:  1 Inhaler    Refill:  0   Order Specific Question:   Supervising Provider    Answer:   Merlyn AlbertLUKING, WILLIAM S [[id number]]   OTC meds as directed for congestion and cough. Trial of albuterol to see if this will help cough and shortness of breath. Contact office to let us know if this helps her symptoms and how often she is having to use it, consider using daily preventive medicine at that time. Warning signs reviewed. Call back if worsens or persists.

## 2017-05-12 DIAGNOSIS — H52221 Regular astigmatism, right eye: Secondary | ICD-10-CM | POA: Diagnosis not present

## 2017-05-12 DIAGNOSIS — H5213 Myopia, bilateral: Secondary | ICD-10-CM | POA: Diagnosis not present

## 2017-09-23 ENCOUNTER — Ambulatory Visit: Payer: BLUE CROSS/BLUE SHIELD | Admitting: Nurse Practitioner

## 2017-09-23 ENCOUNTER — Encounter: Payer: Self-pay | Admitting: Nurse Practitioner

## 2017-09-23 VITALS — BP 122/84 | Temp 98.7°F | Wt 167.0 lb

## 2017-09-23 DIAGNOSIS — J069 Acute upper respiratory infection, unspecified: Secondary | ICD-10-CM | POA: Diagnosis not present

## 2017-09-23 MED ORDER — AZITHROMYCIN 250 MG PO TABS: ORAL_TABLET | ORAL | 0 refills | Status: DC

## 2017-09-24 ENCOUNTER — Encounter: Payer: Self-pay | Admitting: Nurse Practitioner

## 2017-09-24 NOTE — Progress Notes (Signed)
Subjective:  Presents for c/o cough x 3 weeks. Slight chest tightness with prolonged cough. No fever. No sore throat. Headache better. Frequent cough, slightly productive. Minimal wheezing, has not used her inhaler. Left ear pain. No reflux or abdominal pain.   Objective:   BP 122/84   Temp 98.7 F (37.1 C) (Oral)   Wt 167 lb (75.8 kg)   BMI 28.22 kg/m  NAD. Alert, oriented. TMs clear effusion. Pharynx injected with PND noted. Neck supple with mild anterior adenopathy. Lungs clear. No wheezing or tachypnea. Heart RRR.   Assessment:  Acute upper respiratory infection    Plan:   Meds ordered this encounter  Medications  . azithromycin (ZITHROMAX Z-PAK) 250 MG tablet    Sig: Take 2 tablets (500 mg) on  Day 1,  followed by 1 tablet (250 mg) once daily on Days 2 through 5.    Dispense:  6 each    Refill:  0    Order Specific Question:   Supervising Provider    Answer:   Merlyn AlbertLUKING, WILLIAM S [[id number]]   Reviewed symptomatic care and warning signs. Call back if worsens or persists. Recommend restarting albuterol inhaler as directed for wheezing or chest tightness.

## 2017-09-28 ENCOUNTER — Other Ambulatory Visit: Payer: Self-pay | Admitting: Nurse Practitioner

## 2017-09-28 ENCOUNTER — Telehealth: Payer: Self-pay | Admitting: Nurse Practitioner

## 2017-09-28 MED ORDER — AMOXICILLIN-POT CLAVULANATE 875-125 MG PO TABS: 1.0000 | ORAL_TABLET | Freq: Two times a day (BID) | ORAL | 0 refills | Status: DC

## 2017-09-28 NOTE — Telephone Encounter (Signed)
Patient seen Lauren [name] on 09/23/17 for URI.  She said that her chest seems tighter, unable to get anything up, cough.  She wants to know what Lauren [name] recommends?  [facility] [place]

## 2017-09-28 NOTE — Telephone Encounter (Signed)
I called and left a message to r/c. To find out more information.(Also has a message for her son Fritzi MandesQuentin in Pt calls).

## 2017-09-28 NOTE — Telephone Encounter (Signed)
I called and left a detailed message that we have called in the rx. Please call us back to confirm receipt of message.

## 2017-09-28 NOTE — Telephone Encounter (Signed)
Sent in Augmentin. Call back if worsens or persists.

## 2017-09-28 NOTE — Telephone Encounter (Signed)
Any wheezing or fever? Any improvement on Zpack?

## 2017-09-28 NOTE — Telephone Encounter (Signed)
Just finished the zpack last night and chest is tight and burns. Has not seen much improvement. No wheezing, and no fever,and some shortness of breath with a productive cough at times.

## 2017-09-30 NOTE — Telephone Encounter (Signed)
Patient picked up Augmentin per pharmacy.

## 2017-10-20 DIAGNOSIS — H11421 Conjunctival edema, right eye: Secondary | ICD-10-CM | POA: Diagnosis not present

## 2018-01-07 ENCOUNTER — Emergency Department (HOSPITAL_COMMUNITY)
Admission: EM | Admit: 2018-01-07 | Discharge: 2018-01-08 | Discharge status: Against Medical Advice | Payer: BLUE CROSS/BLUE SHIELD | Attending: Emergency Medicine | Admitting: Emergency Medicine

## 2018-01-07 ENCOUNTER — Encounter (HOSPITAL_COMMUNITY): Payer: Self-pay | Admitting: *Deleted

## 2018-01-07 DIAGNOSIS — M25561 Pain in right knee: Secondary | ICD-10-CM | POA: Insufficient documentation

## 2018-01-07 DIAGNOSIS — Z79899 Other long term (current) drug therapy: Secondary | ICD-10-CM | POA: Diagnosis not present

## 2018-01-07 DIAGNOSIS — E119 Type 2 diabetes mellitus without complications: Secondary | ICD-10-CM | POA: Insufficient documentation

## 2018-01-07 DIAGNOSIS — M79604 Pain in right leg: Secondary | ICD-10-CM | POA: Diagnosis not present

## 2018-01-07 NOTE — ED Triage Notes (Signed)
Pt with pain behind right knee ongoing for a week.  No redness noted.  Pt denies any prolonged sitting or travel, denies birth control.

## 2018-01-07 NOTE — ED Provider Notes (Signed)
[facility] EMERGENCY DEPARTMENT Provider Note   CSN: [id number] Arrival date & time: 01/07/18  2145     History   Chief Complaint Chief Complaint  Patient presents with  . Leg Pain    HPI Lauren [name] is a 37 y.o. female.  The history is provided by the patient.  Leg Pain   This is a new problem. The current episode started more than 1 week ago. The pain is present in the right knee. The quality of the pain is described as aching ("feels like swelling and tightness behind the knee"). The pain is moderate. Pertinent negatives include full range of motion and no stiffness. The symptoms are aggravated by activity. She has tried nothing for the symptoms. There has been no history of extremity trauma.    Past Medical History:  Diagnosis Date  . Anemia   . Anxiety    no meds currently  . Diabetes mellitus without complication (HCC)    Gestational  . Hx of preterm delivery, currently pregnant   . Hx of varicella   . Kidney stone 2014  . Postpartum depression    took medication for short time    Patient Active Problem List   Diagnosis Date Noted  . Cough variant asthma 04/15/2017  . Depression with anxiety 08/01/2014    Past Surgical History:  Procedure Laterality Date  . LITHOTRIPSY       OB History    Gravida  3   Para  3   Term  2   Preterm  1   AB      Living  3     SAB      TAB      Ectopic      Multiple      Live Births  3            Home Medications    Prior to Admission medications   Medication Sig Start Date End Date Taking? Authorizing Provider  Aspirin-Acetaminophen-Caffeine (EXCEDRIN MIGRAINE PO) Take 2 tablets by mouth as needed (headache).   Yes [provider]  albuterol (PROVENTIL HFA;VENTOLIN HFA) 108 (90 Base) MCG/ACT inhaler Inhale 2 puffs into the lungs every 4 (four) hours as needed. 04/14/17   Campbell Riches, NP  amoxicillin-clavulanate (AUGMENTIN) 875-125 MG tablet Take 1 tablet by mouth 2 (two) times  daily. Patient not taking: Reported on 01/07/2018 09/28/17   Campbell Riches, NP  azithromycin (ZITHROMAX Z-PAK) 250 MG tablet Take 2 tablets (500 mg) on  Day 1,  followed by 1 tablet (250 mg) once daily on Days 2 through 5. Patient not taking: Reported on 01/07/2018 09/23/17   Campbell Riches, NP    Family History Family History  Problem Relation Age of Onset  . Hypertension Mother   . Hyperlipidemia Mother   . Diabetes Mother   . Heart disease Father   . Heart disease Paternal Grandfather   . Mental retardation Maternal Aunt   . Heart disease Maternal Grandfather   . Heart disease Paternal Grandmother   . Cancer Paternal Grandmother        throat    Social History Social History   Tobacco Use  . Smoking status: Never Smoker  . Smokeless tobacco: Never Used  Substance Use Topics  . Alcohol use: No  . Drug use: No     Allergies   Patient has no known allergies.   Review of Systems Review of Systems  Constitutional: Negative for activity change.  All ROS Neg except as noted in HPI  HENT: Negative for nosebleeds.   Eyes: Negative for photophobia and discharge.  Respiratory: Negative for cough, chest tightness, shortness of breath and wheezing.   Cardiovascular: Negative for chest pain and palpitations.  Gastrointestinal: Negative for abdominal pain and blood in stool.  Genitourinary: Negative for dysuria, frequency and hematuria.  Musculoskeletal: Positive for arthralgias. Negative for back pain, neck pain and stiffness.       Leg pain  Skin: Negative.   Neurological: Negative for dizziness, seizures and speech difficulty.  Psychiatric/Behavioral: Negative for confusion and hallucinations.     Physical Exam Updated Vital Signs BP (!) 131/93 (BP Location: Right Arm)   Pulse 86   Temp 97.8 F (36.6 C) (Oral)   Resp 17   Ht 5\' 4"  (1.626 m)   Wt 73 kg (161 lb)   LMP 12/28/2017   SpO2 99%   BMI 27.64 kg/m   Physical Exam  Constitutional: She is  oriented to person, place, and time. She appears well-developed and well-nourished.  Non-toxic appearance.  HENT:  Head: Normocephalic.  Right Ear: Tympanic membrane and external ear normal.  Left Ear: Tympanic membrane and external ear normal.  Eyes: Pupils are equal, round, and reactive to light. EOM and lids are normal.  Neck: Normal range of motion. Neck supple. Carotid bruit is not present.  Cardiovascular: Normal rate, regular rhythm, normal heart sounds, intact distal pulses and normal pulses.  Pulmonary/Chest: Breath sounds normal. No respiratory distress. She has no wheezes. She has no rales.  Abdominal: Soft. Bowel sounds are normal. There is no tenderness. There is no guarding.  Musculoskeletal: Normal range of motion. She exhibits no edema or deformity.  Neg Homan's Sign. No swelling or temp changes of the right lower extrremity. DP 2+ bilat. Cap refill <2 sec bilat. Right calf measures 35.6cm 6cm from the tibial tuberosity. The left calf measures 35.5cm 6cm from the tibial tuberosity.  Lymphadenopathy:       Head (right side): No submandibular adenopathy present.       Head (left side): No submandibular adenopathy present.    She has no cervical adenopathy.  Neurological: She is alert and oriented to person, place, and time. She has normal strength. No cranial nerve deficit or sensory deficit.  Skin: Skin is warm and dry.  Psychiatric: She has a normal mood and affect. Her speech is normal.  Nursing note and vitals reviewed.    ED Treatments / Results  Labs (all labs ordered are listed, but only abnormal results are displayed) Labs Reviewed  CBC WITH DIFFERENTIAL/PLATELET  BASIC METABOLIC PANEL  D-DIMER, QUANTITATIVE (NOT AT [facility])    EKG None  Radiology No results found.  Procedures Procedures (including critical care time)  Medications Ordered in ED Medications - No data to display   Initial Impression / Assessment and Plan / ED Course  I have reviewed the  triage vital signs and the nursing notes.  Pertinent labs & imaging results that were available during my care of the patient were reviewed by me and considered in my medical decision making (see chart for details).      Final Clinical Impressions(s) / ED Diagnoses MDM Patient presents to the emergency department with a complaint of pain behind the right knee.  Patient is concerned that she may have a blood clot.  She denies any recent injury or trauma.  There is been no prolonged trips, or prolonged periods of inactivity.  No swelling noted at this  time.  No temperature changes noted at this time.  The patient is neurovascularly intact.  I discussed the findings of the examination with the patient.  I asked the patient for permission to obtain a basic metabolic panel to evaluate the electrolytes, as well as a d-dimer test which is sensitive for DVT and/or PE.  Patient gives permission to proceed.  Informed by the nurse that when the attempts were made to obtain the blood, the patient decided that she did not want to have the test done.  I returned to the room to discuss the tests with the patient, but the patient left the emergency department AGAINST MEDICAL ADVICE.   Final diagnoses:  Right leg pain    ED Discharge Orders    None       Ivery QualeBryant, Amylah Will, PA-C 01/08/18 [id number]    Glynn Octaveancour, Stephen, MD 01/09/18 [phone]

## 2018-01-08 NOTE — ED Notes (Signed)
RN hourly rounding on Pt's and noticed Pt no longer in her room and her visitor was gone as well. RN notified [place], [place] Pt left AMA.

## 2018-01-08 NOTE — ED Notes (Signed)
RN attempted to start IV on Pt and retrieve blood to complete Orders per PA Hobson. Pt stated she felt nauseated and did not want to be stuck with a needle and really felt fine and just wanted to go home. RN assured Pt he would not attempt to stick her again and would notify the PA, Hobson. PA, Hobson notified of Pt's wishes and agreed to go speak with the Pt.

## 2018-01-10 ENCOUNTER — Telehealth: Payer: Self-pay

## 2018-01-10 NOTE — Telephone Encounter (Signed)
Per Pt she has been having cramping and swelling behind right knee off and on for over a week now. She states she went to the Ed on Saturday night and was told it was not swollen and they did not do any scans as the people in radiology were gone and did not do blood work to test for a Dvt as they could not get her blood. I spoke with Dr. Lubertha SouthSteve Luking and informed him of her complaints and that we were full for the days at the pt called at 1:30 pm on Tuesday 01/10/2018. He advised that we could see her tomorrow,but if felt she needed to be seen by someone sooner go to the ed or urgent care the pt states understanding and was transferred to the front to set up an appointment for 01/11/2018 here at [facility].

## 2018-01-10 NOTE — Telephone Encounter (Signed)
ok 

## 2018-01-11 ENCOUNTER — Ambulatory Visit (HOSPITAL_COMMUNITY)
Admission: RE | Admit: 2018-01-11 | Discharge: 2018-01-11 | Disposition: A | Discharge status: Home / Self Care | Payer: BLUE CROSS/BLUE SHIELD | Source: Ambulatory Visit | Attending: Family Medicine | Admitting: Family Medicine

## 2018-01-11 ENCOUNTER — Telehealth: Payer: Self-pay | Admitting: *Deleted

## 2018-01-11 ENCOUNTER — Ambulatory Visit: Payer: BLUE CROSS/BLUE SHIELD | Admitting: Family Medicine

## 2018-01-11 ENCOUNTER — Encounter: Payer: Self-pay | Admitting: Family Medicine

## 2018-01-11 VITALS — BP 118/76 | Ht 64.5 in | Wt 165.4 lb

## 2018-01-11 DIAGNOSIS — M79604 Pain in right leg: Secondary | ICD-10-CM | POA: Insufficient documentation

## 2018-01-11 DIAGNOSIS — F418 Other specified anxiety disorders: Secondary | ICD-10-CM | POA: Diagnosis not present

## 2018-01-11 DIAGNOSIS — M7989 Other specified soft tissue disorders: Secondary | ICD-10-CM

## 2018-01-11 MED ORDER — ESCITALOPRAM OXALATE 10 MG PO TABS: 10.0000 mg | ORAL_TABLET | Freq: Every day | ORAL | 5 refills | Status: DC

## 2018-01-11 NOTE — Progress Notes (Signed)
   Subjective:  Arrives with numerous concerns  Patient ID: Lauren [name], female    DOB: 1980-09-25, 37 y.o.   MRN: [id number]  HPI  Patient arrives from an ER follow up for right leg swelling and pain. Complete hospital ER note reviewed in presence of patient Leg feels prett similar to four day s ao   Feels cramping in nature''  Recalls no injury   Has felt tight and achey , partic behind the knee  Pos stroke hx in sister++++++  Had cousin with dvt and passed away from it   Anxiety worsenig of late A lot of stress going on.  Her husband left her.  Single mom.  Full-time work.  Challenges at home and at work.  Anxious and nervous all the time.  Stress regarding multiple issues.  Also notes mild depression.  Sounds sleeping at times  Can feel puffiness behind the knee    Patient very worried about potential for DVT. Tried to get the blood test,  And it did not go well.  They were unable to do blood work patient  Review of Systems No headache, no major weight loss or weight gain, no chest pain no back pain abdominal pain no change in bowel habits complete ROS otherwise negative     Objective:   Physical Exam Right knee positiveAlert and oriented, vitals reviewed and stable, NAD ENT-TM's and ext canals WNL bilat via otoscopic exam Soft palate, tonsils and post pharynx WNL via oropharyngeal exam Neck-symmetric, no masses; thyroid nonpalpable and nontender Pulmonary-no tachypnea or accessory muscle use; Clear without wheezes via auscultation Card--no abnrml murmurs, rhythm reg and rate WNL Carotid pulses symmetric, without bruits Puffiness behind the knee negative Homans sign some mild edema right ankle.  Some mild swelling.  1 cm increased diameter.  2 cm below the tibial tubercle compared to left       Assessment & Plan:  #1 generalized anxiety disorder with element of depression discussed.  At length.  Options discussed.  Patient would like to try medication  2.   Knee pain with element of leg swelling.  Anxiety regarding DVT.  Discussed.  We will do ultrasound.  Addendum ultrasound negative.  Initiate Lexapro 10 daily.  Side effects benefits discussed.  Exercise encouraged.  Recheck in several months

## 2018-01-11 NOTE — Telephone Encounter (Signed)
[place] negative per dr Brett Canalessteve. Pt notified.

## 2018-01-25 ENCOUNTER — Other Ambulatory Visit: Payer: Self-pay | Admitting: Family Medicine

## 2018-01-25 ENCOUNTER — Telehealth: Payer: Self-pay | Admitting: Family Medicine

## 2018-01-25 MED ORDER — ONDANSETRON 4 MG PO TBDP: ORAL_TABLET | ORAL | 0 refills | Status: DC

## 2018-01-25 NOTE — Telephone Encounter (Signed)
Spoke with patient and med sent into [facility] on [address]

## 2018-01-25 NOTE — Telephone Encounter (Signed)
Patients son was diagnosed with a stomach virus yesterday by Dr. Brett Canales Fritzi Mandes).  Now, Lauren [name] is having an uneasy feeling in her stomach and would like Zofran called in.  [facility] on [address]

## 2018-01-25 NOTE — Telephone Encounter (Signed)
zofr 4 odt 20 one q six hrs prn

## 2018-02-27 ENCOUNTER — Telehealth: Payer: Self-pay | Admitting: Family Medicine

## 2018-02-27 NOTE — Telephone Encounter (Signed)
Message was redone in pt's chart.

## 2018-02-27 NOTE — Telephone Encounter (Signed)
Patients son Lauren [name](Lauren [name]) was prescribed cefPROZIL (CEFZIL) 250 MG/5ML suspension last week by Dr. Brett CanalesSteve.  It is not covered under his insurance.  Mom would like for Dr. Brett CanalesSteve to send the same Rx in for her so that her insurance will cover it.  She said he has done this before.  [facility] on [address]

## 2018-03-02 NOTE — Telephone Encounter (Signed)
ido not prescribe oral abx in others peples name so absolutely right answer

## 2018-03-02 NOTE — Telephone Encounter (Signed)
Mother called back and she was told that dr Brett Canalessteve could not prescribe a med for her to give her son. She verbalized understanding and states she just paid cash for the antibiotic and he is feeling better. This was documented in pt's chart as well.

## 2018-04-12 ENCOUNTER — Ambulatory Visit: Payer: BLUE CROSS/BLUE SHIELD | Admitting: Family Medicine

## 2018-05-23 ENCOUNTER — Encounter: Payer: Self-pay | Admitting: Family Medicine

## 2018-05-23 ENCOUNTER — Ambulatory Visit: Payer: BLUE CROSS/BLUE SHIELD | Admitting: Family Medicine

## 2018-05-23 VITALS — BP 122/72 | Ht 64.5 in | Wt 167.0 lb

## 2018-05-23 DIAGNOSIS — F418 Other specified anxiety disorders: Secondary | ICD-10-CM

## 2018-05-23 DIAGNOSIS — Z01419 Encounter for gynecological examination (general) (routine) without abnormal findings: Secondary | ICD-10-CM | POA: Diagnosis not present

## 2018-05-23 DIAGNOSIS — Z6828 Body mass index (BMI) 28.0-28.9, adult: Secondary | ICD-10-CM | POA: Diagnosis not present

## 2018-05-23 DIAGNOSIS — F411 Generalized anxiety disorder: Secondary | ICD-10-CM | POA: Diagnosis not present

## 2018-05-23 MED ORDER — ESCITALOPRAM OXALATE 10 MG PO TABS: 10.0000 mg | ORAL_TABLET | Freq: Every day | ORAL | 5 refills | Status: DC

## 2018-05-23 NOTE — Progress Notes (Signed)
   Subjective:    Patient ID: Lauren [name], female    DOB: Oct 19, 1980, 37 y.o.   MRN: [id number]  HPI Patient is here today to follow up on anxiety and depression. She states she is much better and has been using the Lexapro as prescribed.   Two weeks in noticed irovement   Not exrcising yet  enregy   Not as anxious now.. Review of Systems No headache, no major weight loss or weight gain, no chest pain no back pain abdominal pain no change in bowel habits complete ROS otherwise negative     Objective:   Physical Exam   Alert vitals stable, NAD. Blood pressure good on repeat. HEENT normal. Lungs clear. Heart regular rate and rhythm.      Assessment & Plan:  Impression generalized anxiety disorder with element of depression.  Clinically much improved on medication.  No obvious side effects.  Patient maintain same, encouraged to get on with her exercise regimen.  Follow-up in as scheduled

## 2018-09-22 ENCOUNTER — Ambulatory Visit: Payer: BLUE CROSS/BLUE SHIELD | Admitting: Family Medicine

## 2018-10-05 ENCOUNTER — Ambulatory Visit: Payer: BLUE CROSS/BLUE SHIELD | Admitting: Family Medicine

## 2018-10-05 ENCOUNTER — Encounter: Payer: Self-pay | Admitting: Family Medicine

## 2018-10-05 VITALS — BP 118/90 | Temp 98.3°F | Ht 64.5 in | Wt 175.0 lb

## 2018-10-05 DIAGNOSIS — R519 Headache, unspecified: Secondary | ICD-10-CM

## 2018-10-05 DIAGNOSIS — F418 Other specified anxiety disorders: Secondary | ICD-10-CM

## 2018-10-05 DIAGNOSIS — R51 Headache: Secondary | ICD-10-CM

## 2018-10-05 DIAGNOSIS — G8929 Other chronic pain: Secondary | ICD-10-CM

## 2018-10-05 MED ORDER — ESCITALOPRAM OXALATE 10 MG PO TABS: 10.0000 mg | ORAL_TABLET | Freq: Every day | ORAL | 5 refills | Status: DC

## 2018-10-05 NOTE — Progress Notes (Signed)
Subjective:    Patient ID: Lauren [name], female    DOB: 05-Apr-1981, 38 y.o.   MRN: [id number]  HPI  Patient is here today to follow up on her chronic illnesses.   She has a history of depression and anxiety and is currently taking Lexapro 10 mg. She states this is helping and would like to continue.Sees a counselor about once per month. Denies SI/HI.   Walking 1-2 times per week for exercise. Working on increasing this. 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  (FYI) ? Patient is having shoulder and neck pain on her right side her Obgyn Dr. Rana SnareLowe had given a muscle relaxer for this and she is having headaches. She thinks this is related to the size of her breast and thinks she needs a reduction to help relieve the symptoms.She states he has given her names of surgeons who would address this for her.  No N/V. Photosensitivity at times. Wakes up with h/a sometimes, wakes up at night with h/a at times. Going on for many years, was seen in 2005 for similar, MRI of brain done at that time - which was normal. H/a 1-3 times per week, located circumferentially. Pt reports pain is bearable, will take excedrin or goody powder which is helpful. No changes in her vision. Not daily h/a.   Review of Systems  Constitutional: Negative for fatigue, fever and unexpected weight change.  Eyes: Negative for visual disturbance.  Neurological: Positive for headaches. Negative for dizziness, syncope, facial asymmetry, speech difficulty, weakness and numbness.  Psychiatric/Behavioral: Negative for dysphoric mood, sleep disturbance and suicidal ideas. The patient is not nervous/anxious.        Objective:   Physical Exam Vitals signs and nursing note reviewed.  Constitutional:      General: She is not in acute distress.    Appearance: She is well-developed.  HENT:     Head: Normocephalic and atraumatic.  Eyes:     General:        Right eye: No discharge.         Left eye: No discharge.     Extraocular Movements: Extraocular movements intact.     Pupils: Pupils are equal, round, and reactive to light.  Neck:     Musculoskeletal: Neck supple.  Cardiovascular:     Rate and Rhythm: Normal rate and regular rhythm.     Heart sounds: Normal heart sounds. No murmur.  Pulmonary:     Effort: Pulmonary effort is normal. No respiratory distress.     Breath sounds: Normal breath sounds.  Skin:    General: Skin is warm and dry.  Neurological:     General: No focal deficit present.     Mental Status: She is alert and oriented to person, place, and time.     Motor: Motor function is intact. No weakness.     Coordination: Coordination is intact. Romberg sign negative. Finger-Nose-Finger Test normal.     Gait: Gait is intact. Gait normal.  Psychiatric:        Mood and Affect: Mood normal.        Behavior: Behavior normal.        Thought Content: Thought content normal.    [facility] screen [facility] 10/05/2018 05/23/2018  Decreased Interest 0 0  Down, Depressed, Hopeless 1 1  PHQ - 2 Score 1 1  Altered sleeping 0 0  Tired, decreased energy 1 1  Change in appetite 0  1  Feeling bad or failure about yourself  0 0  Trouble concentrating 0 0  Moving slowly or fidgety/restless 0 0  Suicidal thoughts 0 0  PHQ-9 Score 2 3  Difficult doing work/chores Not difficult at all Not difficult at all    GAD 7 : Generalized Anxiety Score 10/05/2018 05/23/2018  Nervous, Anxious, on Edge 0 1  Control/stop worrying 1 0  Worry too much - different things 0 0  Trouble relaxing 1 1  Restless 0 0  Easily annoyed or irritable 1 1  Afraid - awful might happen 0 0  Total GAD 7 Score 3 3  Anxiety Difficulty Not difficult at all Not difficult at all          Assessment & Plan:  1. Depression with anxiety Pt doing well on current dose of lexapro and would like to continue. Reviewed phq 9 and gad 7 with patient. Will refill. Encouraged regular exercise. Recommend f/u  for this in 6 months.   2. Chronic nonintractable headache, unspecified headache type Discussed h/a symptoms and hx with Dr. Brett CanalesSteve. Offered to refer patient to h/a specialist, she declines at this time. Recommend she keep a h/a diary and f/u for a more focused visit on her h/a with Dr. Brett CanalesSteve in about 2-4 weeks. Warning signs discussed. F/u sooner if needed.  Discussed with patient h/a unlikely to be related to her breast size given the longevity and description of her symptoms. Her neck shoulder and back discomfort could very well be related to this, recommend she follow back up with Dr. Rana SnareLowe as she said he was going to submit a referral to a surgeon to consider breast reduction surgery.   Dr. Lubertha SouthSteve Luking was consulted on this case and is in agreement with the above treatment plan.

## 2019-05-24 ENCOUNTER — Other Ambulatory Visit: Payer: Self-pay

## 2019-05-25 ENCOUNTER — Ambulatory Visit (INDEPENDENT_AMBULATORY_CARE_PROVIDER_SITE_OTHER): Payer: BLUE CROSS/BLUE SHIELD | Admitting: Nurse Practitioner

## 2019-05-25 ENCOUNTER — Encounter: Payer: Self-pay | Admitting: Nurse Practitioner

## 2019-05-25 DIAGNOSIS — L03012 Cellulitis of left finger: Secondary | ICD-10-CM

## 2019-05-25 MED ORDER — CEPHALEXIN 500 MG PO CAPS: 500.0000 mg | ORAL_CAPSULE | Freq: Three times a day (TID) | ORAL | 0 refills | Status: DC

## 2019-05-25 NOTE — Progress Notes (Signed)
   Subjective:    Patient ID: Lauren [name], female    DOB: 1981/08/14, 38 y.o.   MRN: [id number]  HPIleft ring finger red and swollen for the past 3 -4 days. Tried neosporin.   Virtual Visit via Video Note  I connected with [place] on 05/25/19 at  3:00 PM EDT by a video enabled telemedicine application and verified that I am speaking with the correct person using two identifiers.  Location: Patient: home Provider: office   I discussed the limitations of evaluation and management by telemedicine and the availability of in person appointments. The patient expressed understanding and agreed to proceed.  History of Present Illness: Presents for complaints of swelling and redness at the cuticle of her left ring finger.  States it was slightly torn and began to have problems 4 days ago.  No fever.  Has been soaking it in warm salt water and applying antibiotic ointment which is greatly helped.   Observations/Objective: Format  Patient present at home Provider present at office Consent for interaction obtained Coronavirus outbreak made virtual visit necessary  Left ring finger has some peeling and dryness with mild erythema.  No significant edema.  Assessment and Plan: Problem List Items Addressed This Visit    None    Visit Diagnoses    Paronychia of finger of left hand    -  Primary   Relevant Medications   cephALEXin (KEFLEX) 500 MG capsule     Meds ordered this encounter  Medications  . cephALEXin (KEFLEX) 500 MG capsule    Sig: Take 1 capsule (500 mg total) by mouth 3 (three) times daily.    Dispense:  21 capsule    Refill:  0    Order Specific Question:   Supervising Provider    Answer:   Sallee Lange A [[id number]]     Follow Up Instructions: Continue warm soaks and antibiotic ointment.  Reviewed signs of infection.  Call back if worsens or persist.   I discussed the assessment and treatment plan with the patient. The patient was provided an opportunity to  ask questions and all were answered. The patient agreed with the plan and demonstrated an understanding of the instructions.   The patient was advised to call back or seek an in-person evaluation if the symptoms worsen or if the condition fails to improve as anticipated.  I provided 15 minutes of non-face-to-face time during this encounter.      Review of Systems     Objective:   Physical Exam        Assessment & Plan:

## 2019-08-13 ENCOUNTER — Other Ambulatory Visit: Payer: Self-pay

## 2019-08-13 DIAGNOSIS — Z20822 Contact with and (suspected) exposure to covid-19: Secondary | ICD-10-CM

## 2019-08-14 ENCOUNTER — Telehealth: Payer: Self-pay | Admitting: *Deleted

## 2019-08-14 LAB — NOVEL CORONAVIRUS, NAA: SARS-CoV-2, NAA: NOT DETECTED

## 2019-08-14 NOTE — Telephone Encounter (Signed)
Patient called ,given negative covid results . 

## 2019-08-23 ENCOUNTER — Other Ambulatory Visit: Payer: Self-pay

## 2019-08-23 DIAGNOSIS — Z20822 Contact with and (suspected) exposure to covid-19: Secondary | ICD-10-CM

## 2019-08-25 LAB — NOVEL CORONAVIRUS, NAA: SARS-CoV-2, NAA: NOT DETECTED

## 2019-09-12 ENCOUNTER — Other Ambulatory Visit: Payer: Self-pay

## 2019-09-12 ENCOUNTER — Ambulatory Visit: Payer: HRSA Program | Attending: Internal Medicine

## 2019-09-12 DIAGNOSIS — Z20828 Contact with and (suspected) exposure to other viral communicable diseases: Secondary | ICD-10-CM | POA: Diagnosis not present

## 2019-09-12 DIAGNOSIS — Z20822 Contact with and (suspected) exposure to covid-19: Secondary | ICD-10-CM

## 2019-09-13 LAB — NOVEL CORONAVIRUS, NAA: SARS-CoV-2, NAA: NOT DETECTED

## 2019-10-22 ENCOUNTER — Encounter: Payer: Self-pay | Admitting: Family Medicine

## 2019-10-25 IMAGING — US US EXTREM LOW VENOUS*R*
1 series · 13 of 24 positions shown · non-contrast
Comparison: None.

CLINICAL DATA: RIGHT leg pain and swelling



[Series 1: us extrem low venous*right* · 0.08mm/px · 13 of 33 slices shown]
[im 1/33]
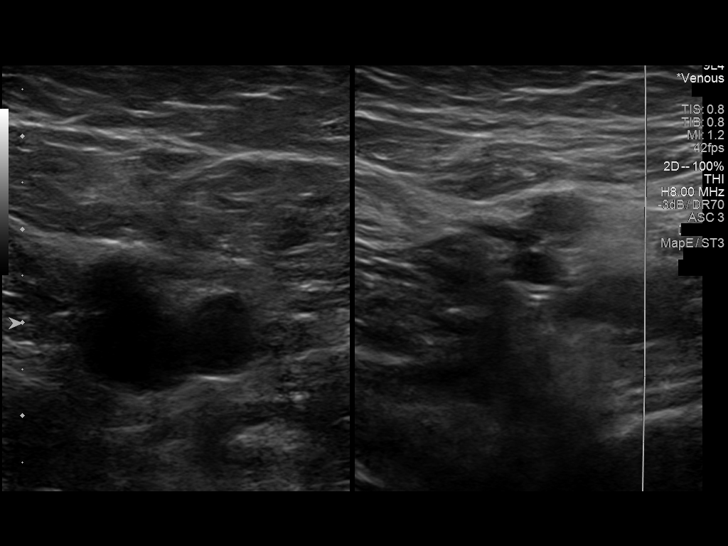
[im 3/33]
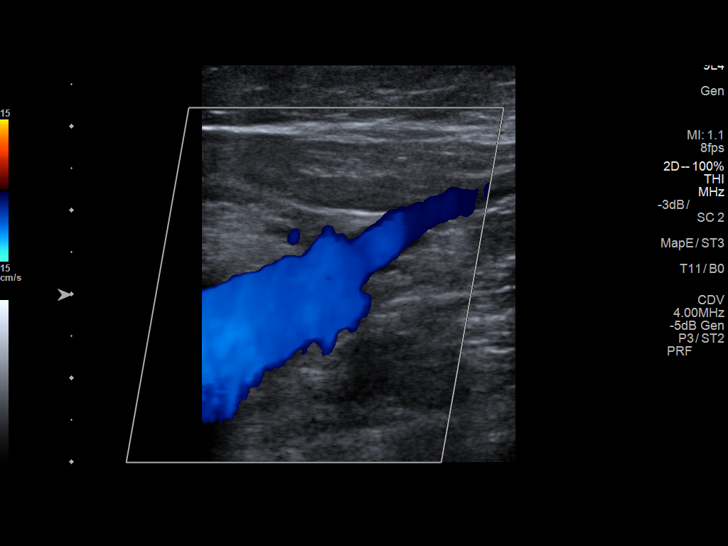
[im 6/33]
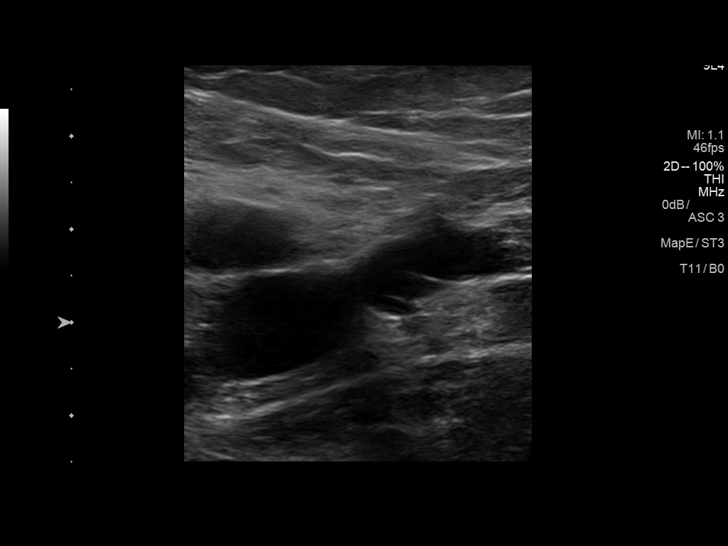
[im 9/33]
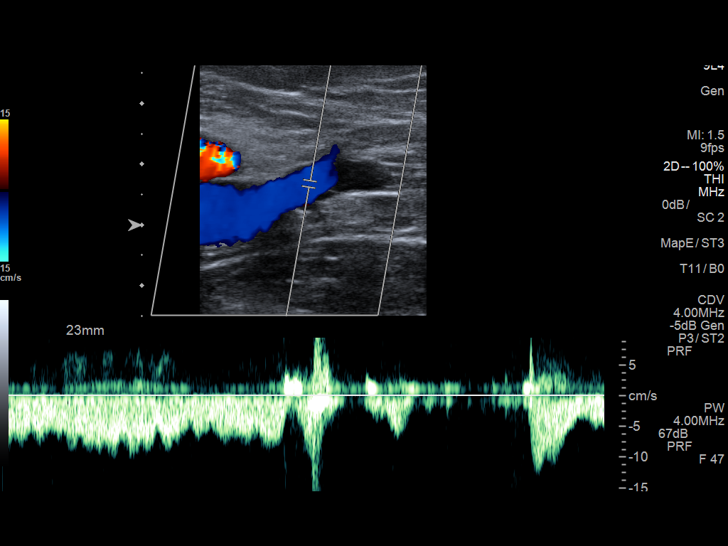
[im 12/33]
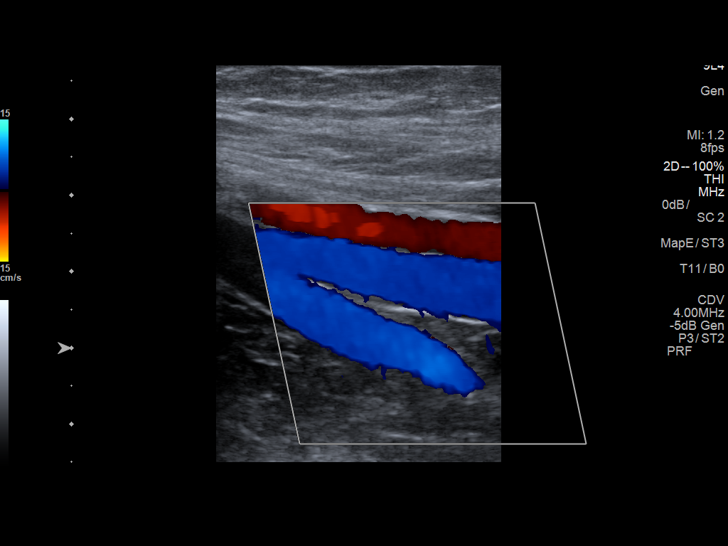
[im 14/33]
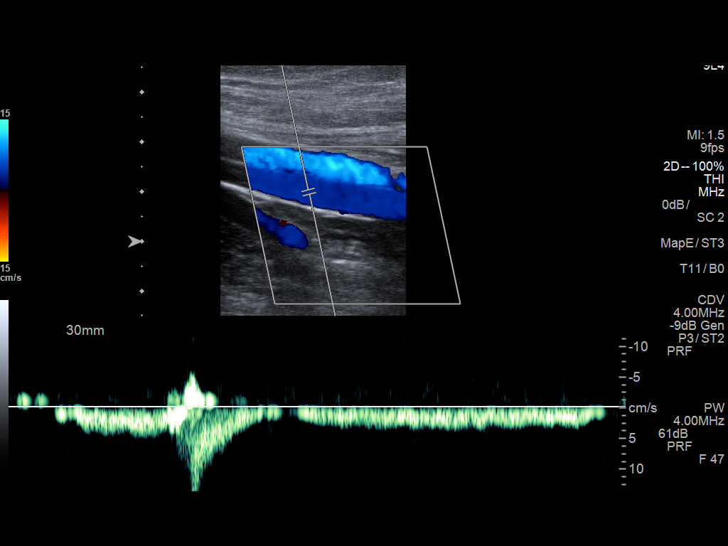
[im 17/33]
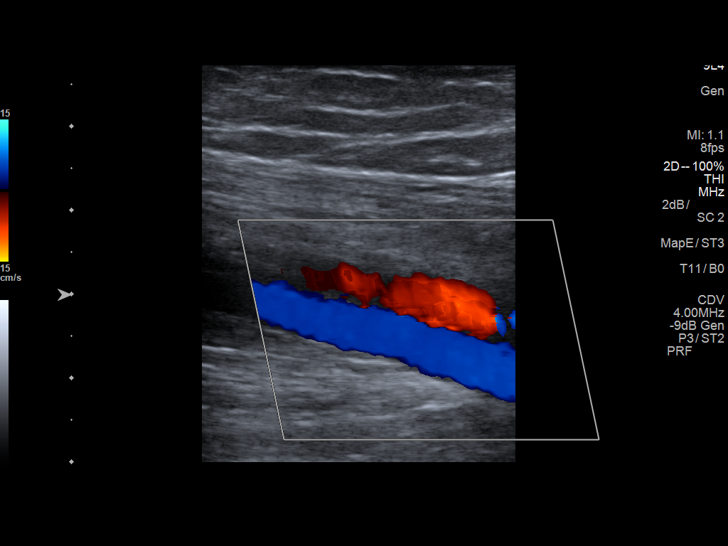
[im 19/33]
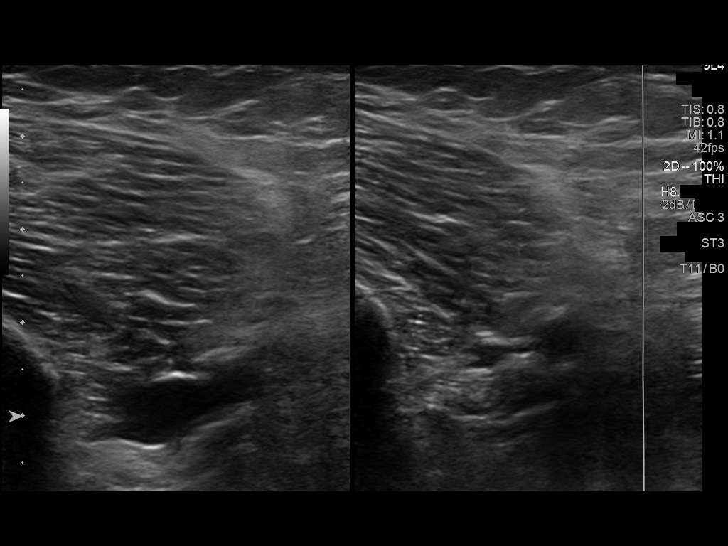
[im 21/33]
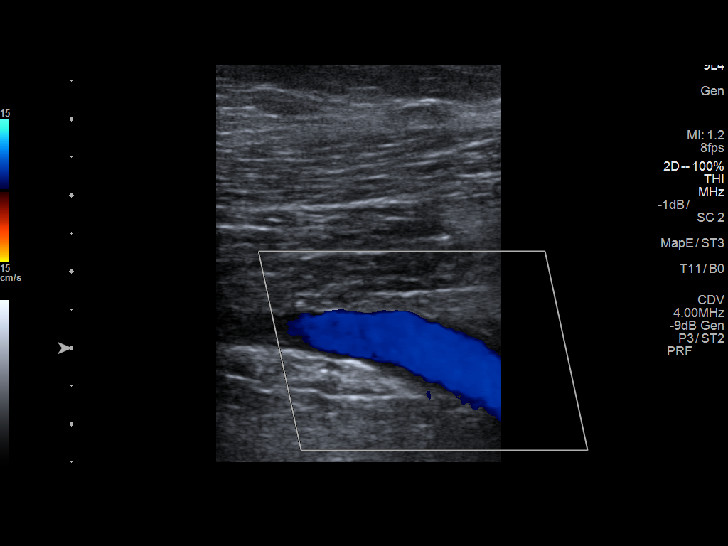
[im 24/33]
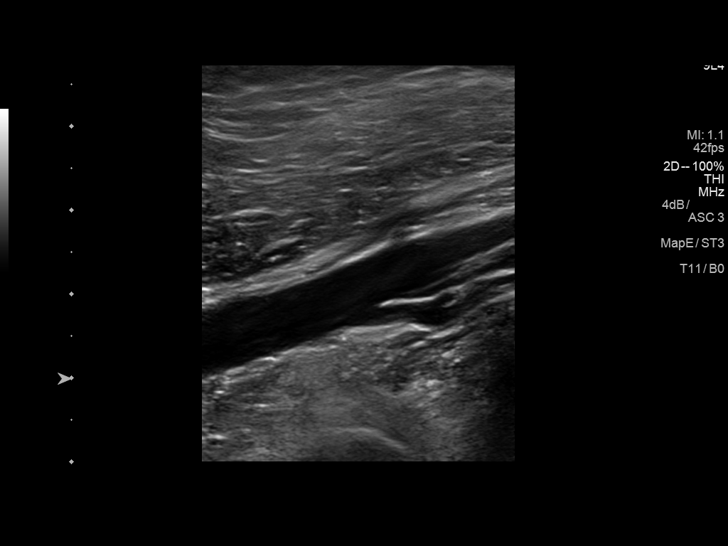
[im 27/33]
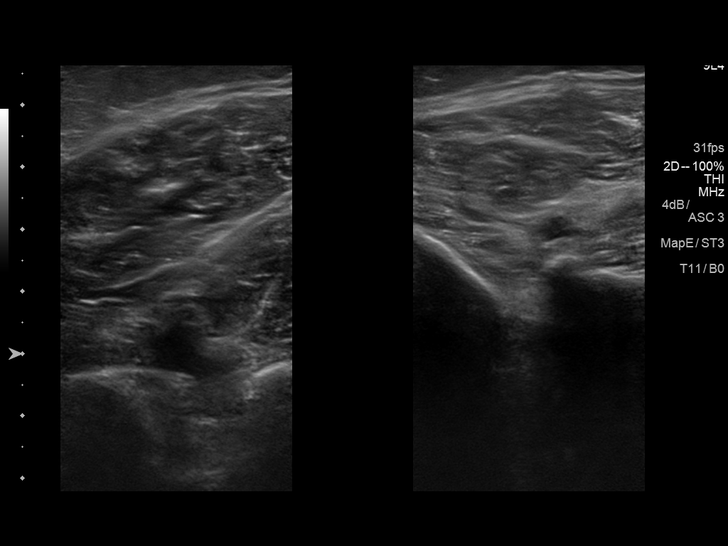
[im 30/33]
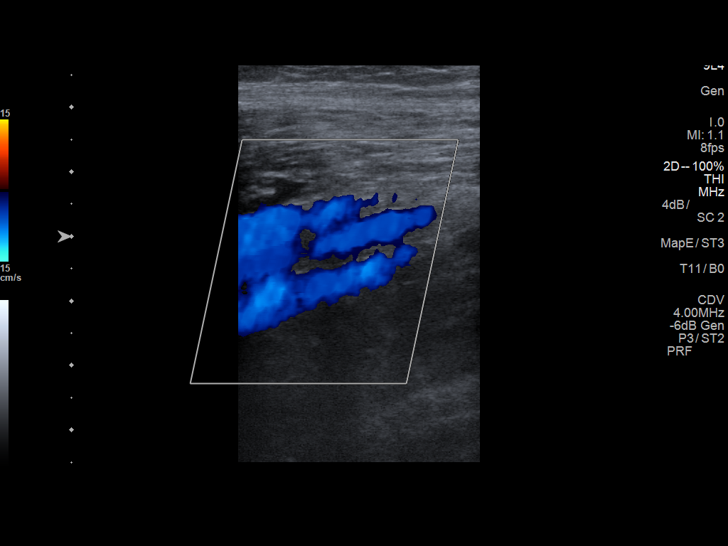
[im 33/33]
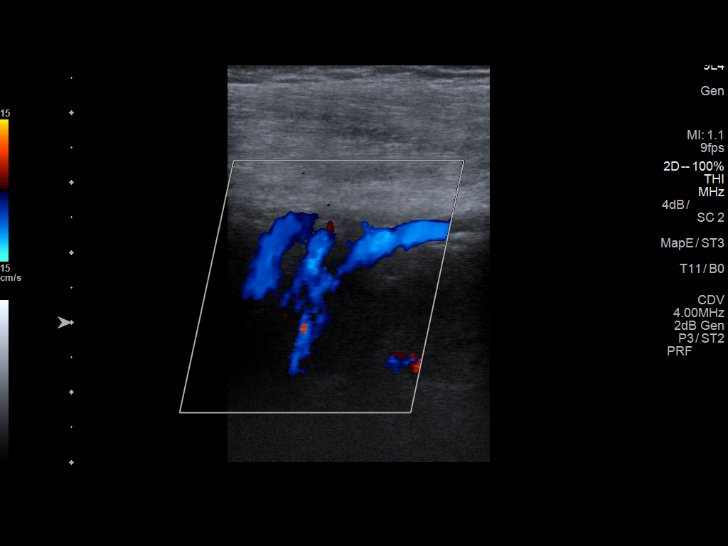

[13 of 24 positions shown; findings below may reference images not displayed]

FINDINGS: Contralateral Common Femoral Vein: Respiratory phasicity is normal
and symmetric with the symptomatic side. No evidence of thrombus.
Normal compressibility.

Common Femoral Vein: No evidence of thrombus. Normal
compressibility, respiratory phasicity and response to augmentation.

Saphenofemoral Junction: No evidence of thrombus. Normal
compressibility and flow on color Doppler imaging.

Profunda Femoral Vein: No evidence of thrombus. Normal
compressibility and flow on color Doppler imaging.

Femoral Vein: No evidence of thrombus. Normal compressibility,
respiratory phasicity and response to augmentation.

Popliteal Vein: No evidence of thrombus. Normal compressibility,
respiratory phasicity and response to augmentation.

Calf Veins: No evidence of thrombus. Normal compressibility and flow
on color Doppler imaging.

Superficial Great Saphenous Vein: No evidence of thrombus. Normal
compressibility.

Venous Reflux:  None.

Other Findings:  None.
IMPRESSION: No evidence of deep venous thrombosis in the RIGHT lower extremity.

## 2020-04-18 ENCOUNTER — Other Ambulatory Visit: Payer: Self-pay

## 2020-04-18 ENCOUNTER — Telehealth (INDEPENDENT_AMBULATORY_CARE_PROVIDER_SITE_OTHER): Payer: Self-pay | Admitting: Family Medicine

## 2020-04-18 ENCOUNTER — Telehealth: Payer: Self-pay | Admitting: Family Medicine

## 2020-04-18 DIAGNOSIS — J0110 Acute frontal sinusitis, unspecified: Secondary | ICD-10-CM

## 2020-04-18 MED ORDER — AMOXICILLIN 500 MG PO CAPS: 500.0000 mg | ORAL_CAPSULE | Freq: Three times a day (TID) | ORAL | 0 refills | Status: DC

## 2020-04-18 NOTE — Telephone Encounter (Signed)
[name], ozbun are scheduled for a virtual visit with your provider today.    Just as we do with appointments in the office, we must obtain your consent to participate.  Your consent will be active for this visit and any virtual visit you may have with one of our providers in the next 365 days.    If you have a MyChart account, I can also send a copy of this consent to you electronically.  All virtual visits are billed to your insurance company just like a traditional visit in the office.  As this is a virtual visit, video technology does not allow for your provider to perform a traditional examination.  This may limit your provider's ability to fully assess your condition.  If your provider identifies any concerns that need to be evaluated in person or the need to arrange testing such as labs, EKG, etc, we will make arrangements to do so.    Although advances in technology are sophisticated, we cannot ensure that it will always work on either your end or our end.  If the connection with a video visit is poor, we may have to switch to a telephone visit.  With either a video or telephone visit, we are not always able to ensure that we have a secure connection.   I need to obtain your verbal consent now.   Are you willing to proceed with your visit today?   Lauren [name] has provided verbal consent on 04/18/2020 for a virtual visit (video or telephone).   Marlowe Shores, LPN 5/85/2778  24:23 AM

## 2020-04-18 NOTE — Progress Notes (Signed)
    Patient ID: Lauren [name], female    DOB: 23-Oct-1980, 39 y.o.   MRN: [id number]   Virtual Visit via Telephone Note  I connected with Lauren [name] on 04/18/20 at 11:00 AM EDT by telephone and verified that I am speaking with the correct person using two identifiers.  Location: Patient: home Provider: office   I discussed the limitations, risks, security and privacy concerns of performing an evaluation and management service by telephone and the availability of in person appointments. I also discussed with the patient that there may be a patient responsible charge related to this service. The patient expressed understanding and agreed to proceed.  Chief Complaint  Patient presents with  . Cough   Subjective:    HPI Pt having scratchy throat, stuffy nose at night, runny nose and drainage. Pt has taken Mucinex. Has been going on for 3 days. Having nasal stuffiness.  Feeling drainage down back of throat. No coughing. No fever, HA, eye or ear symptoms. Going out of town, and just wanting to call in "in case" she might need an antibiotic. Having slight yellow drainage from nose. Working from home. No sick contacts other than daughter with similar and they have been home, not going out much. meds- taking mucinex and alka-seltzer 1x.   Medical History Lauren [name] has a past medical history of Anemia, Anxiety, Diabetes mellitus without complication (HCC), preterm delivery, currently pregnant, varicella, Kidney stone (2014), and Postpartum depression.   Outpatient Encounter Medications as of 04/18/2020  Medication Sig  . albuterol (PROVENTIL HFA;VENTOLIN HFA) 108 (90 Base) MCG/ACT inhaler Inhale 2 puffs into the lungs every 4 (four) hours as needed.  . Aspirin-Acetaminophen-Caffeine (EXCEDRIN MIGRAINE PO) Take 2 tablets by mouth as needed (headache).  Marland Kitchen MILI 0.25-35 MG-MCG tablet TK 1 T PO QD  . amoxicillin (AMOXIL) 500 MG capsule Take 1 capsule (500 mg total) by mouth 3 (three)  times daily.  . [DISCONTINUED] cephALEXin (KEFLEX) 500 MG capsule Take 1 capsule (500 mg total) by mouth 3 (three) times daily.   No facility-administered encounter medications on file as of 04/18/2020.    Review of Systems  Constitutional: Negative for chills and fever.  HENT: Positive for congestion, postnasal drip and sore throat. Negative for ear pain, rhinorrhea, sinus pressure and sinus pain.   Eyes: Negative for pain, discharge and itching.  Respiratory: Positive for cough.   Gastrointestinal: Negative for constipation, diarrhea, nausea and vomiting.    Vitals There were no vitals taken for this visit.  Objective:   Physical Exam  Phone visit.  Assessment and Plan   1. Acute non-recurrent frontal sinusitis - amoxicillin (AMOXIL) 500 MG capsule; Take 1 capsule (500 mg total) by mouth 3 (three) times daily.  Dispense: 30 capsule; Refill: 0    Gave watch and wait script to take if needed in next 3-5 days if not improving. tylenol or ibuprofen prn. floanse and inc fluids. Cont with mucinex.  F/u if not improving.    Follow Up Instructions:    I discussed the assessment and treatment plan with the patient. The patient was provided an opportunity to ask questions and all were answered. The patient agreed with the plan and demonstrated an understanding of the instructions.   The patient was advised to call back or seek an in-person evaluation if the symptoms worsen or if the condition fails to improve as anticipated.  I provided 20 minutes of non-face-to-face time during this encounter.

## 2020-05-09 ENCOUNTER — Telehealth: Payer: Self-pay | Admitting: Family Medicine

## 2020-05-09 NOTE — Telephone Encounter (Signed)
There isn't really anything needed to call in for this.  It's symptomatic tx for viral illness. Take mucinex as directed, could change to delsym if needed.  Increase fluid intake. Vit C, vit D and zinc are recommended vit to take.  zinc dose of 75 mg-100mg . daily doses of vitamin D3 (1,000-4,000 IU), vitamin C (500 mg), and melatonin (0.3mg -2 mg each night).   Can use her albuterol as needed for chest tightness or coughing.  Flonase for nasal congestion.  If not getting better with all this, then need to go to ER for further evaluation and check of vitals. Does she have pulse oximeter to check oxygen?  Can order from [facility] if needed or from pharmacy.  Hope she feels better.   Take care,   Dr. Ladona Ridgel

## 2020-05-09 NOTE — Telephone Encounter (Signed)
Pt has tested positive for COVID and would like to know if there is something that can be called in. She is having some tightness in chest at times. She states she is doing breathing exercises  and laying on her stomach for it and it is helping. She is coughing and is stuffy. She has been taking mucinex to help with that.   [facility] [place].

## 2020-05-09 NOTE — Telephone Encounter (Signed)
Please advise. Thank you

## 2020-05-12 NOTE — Telephone Encounter (Signed)
Left message to return call and also sent MyChart message

## 2020-05-14 ENCOUNTER — Ambulatory Visit (INDEPENDENT_AMBULATORY_CARE_PROVIDER_SITE_OTHER): Payer: Self-pay | Admitting: Family Medicine

## 2020-05-14 VITALS — HR 100 | Temp 100.4°F | Resp 20

## 2020-05-14 DIAGNOSIS — R059 Cough, unspecified: Secondary | ICD-10-CM

## 2020-05-14 DIAGNOSIS — R05 Cough: Secondary | ICD-10-CM

## 2020-05-14 DIAGNOSIS — U071 COVID-19: Secondary | ICD-10-CM

## 2020-05-14 DIAGNOSIS — R0602 Shortness of breath: Secondary | ICD-10-CM

## 2020-05-14 MED ORDER — ALBUTEROL SULFATE HFA 108 (90 BASE) MCG/ACT IN AERS: 2.0000 | INHALATION_SPRAY | RESPIRATORY_TRACT | 0 refills | Status: DC | PRN

## 2020-05-14 MED ORDER — GUAIFENESIN-CODEINE 100-10 MG/5ML PO SOLN: 5.0000 mL | Freq: Four times a day (QID) | ORAL | 0 refills | Status: DC | PRN

## 2020-05-14 MED ORDER — PREDNISONE 20 MG PO TABS: 40.0000 mg | ORAL_TABLET | Freq: Every day | ORAL | 0 refills | Status: DC

## 2020-05-14 NOTE — Progress Notes (Signed)
Patient ID: Lauren [name], female    DOB: 05/05/1981, 39 y.o.   MRN: [id number]   Chief Complaint  Patient presents with  . Cough   Subjective:    Cough This is a new problem. Episode onset: Tested positive for Covid 9 days ago. The cough is non-productive. Associated symptoms include shortness of breath (with exertion). Pertinent negatives include no chest pain, chills, ear pain, fever, headaches, postnasal drip, rash, rhinorrhea, sore throat or wheezing. Associated symptoms comments: Body aches, chest soreness and coughing. Coughing seems slight improved today.. Treatments tried: mucinex. The treatment provided no relief.   Pt feeling worse yesterday, feeling about 50-60% better today.  Still having dry coughing.  Some drainage from nose.  Lost taste and smell.  Breathing was worrying her yesterday.  Friend brought oximeter over pt was 95% at home.  Pt was noticing she gets winded with small tasks.  No fever, or ear pain, or sore throat. Has had [name] in appetite. Drinking water and gatorade. Pt is day 10 today of onset of symptoms.  Medical History Lauren [name] has a past medical history of Anemia, Anxiety, Diabetes mellitus without complication (HCC), preterm delivery, currently pregnant, varicella, Kidney stone (2014), and Postpartum depression.   Outpatient Encounter Medications as of 05/14/2020  Medication Sig  . albuterol (VENTOLIN HFA) 108 (90 Base) MCG/ACT inhaler Inhale 2 puffs into the lungs every 4 (four) hours as needed.  . Aspirin-Acetaminophen-Caffeine (EXCEDRIN MIGRAINE PO) Take 2 tablets by mouth as needed (headache).  Marland Kitchen guaiFENesin-codeine 100-10 MG/5ML syrup Take 5 mLs by mouth every 6 (six) hours as needed for cough.  Marland Kitchen MILI 0.25-35 MG-MCG tablet TK 1 T PO QD  . predniSONE (DELTASONE) 20 MG tablet Take 2 tablets (40 mg total) by mouth daily with breakfast.  . [DISCONTINUED] albuterol (PROVENTIL HFA;VENTOLIN HFA) 108 (90 Base) MCG/ACT inhaler Inhale 2 puffs into the  lungs every 4 (four) hours as needed. (Patient not taking: Reported on 05/14/2020)  . [DISCONTINUED] amoxicillin (AMOXIL) 500 MG capsule Take 1 capsule (500 mg total) by mouth 3 (three) times daily.   No facility-administered encounter medications on file as of 05/14/2020.     Review of Systems  Constitutional: Negative for chills and fever.  HENT: Negative for congestion, ear pain, postnasal drip, rhinorrhea and sore throat.   Respiratory: Positive for cough and shortness of breath (with exertion). Negative for wheezing.   Cardiovascular: Negative for chest pain and leg swelling.  Gastrointestinal: Negative for abdominal pain, diarrhea, nausea and vomiting.  Genitourinary: Negative for dysuria and frequency.  Musculoskeletal: Negative for arthralgias and back pain.  Skin: Negative for rash.  Neurological: Negative for dizziness, weakness and headaches.     Vitals Pulse 100   Temp (!) 100.4 F (38 C) (Temporal) Comment: outside in the heat  Resp 20   SpO2 99%   Objective:   Physical Exam Vitals and nursing note reviewed.  Constitutional:      General: She is not in acute distress.    Appearance: Normal appearance. She is not toxic-appearing.  HENT:     Head: Normocephalic and atraumatic.     Right Ear: Tympanic membrane, ear canal and external ear normal.     Left Ear: Tympanic membrane, ear canal and external ear normal.     Nose: Nose normal. No congestion or rhinorrhea.     Mouth/Throat:     Mouth: Mucous membranes are moist.     Pharynx: Oropharynx is clear. No oropharyngeal exudate or posterior oropharyngeal erythema.  Eyes:     Extraocular Movements: Extraocular movements intact.     Conjunctiva/sclera: Conjunctivae normal.     Pupils: Pupils are equal, round, and reactive to light.  Cardiovascular:     Rate and Rhythm: Normal rate and regular rhythm.     Pulses: Normal pulses.     Heart sounds: No murmur heard.   Pulmonary:     Effort: Pulmonary effort is  normal. No respiratory distress.     Breath sounds: No wheezing, rhonchi or rales.  Musculoskeletal:     Cervical back: Normal range of motion.  Lymphadenopathy:     Cervical: No cervical adenopathy.  Skin:    General: Skin is warm and dry.     Findings: No rash.  Neurological:     Mental Status: She is alert and oriented to person, place, and time.  Psychiatric:        Mood and Affect: Mood normal.        Behavior: Behavior normal.      Assessment and Plan   1. COVID-19  2. Coughing - albuterol (VENTOLIN HFA) 108 (90 Base) MCG/ACT inhaler; Inhale 2 puffs into the lungs every 4 (four) hours as needed.  Dispense: 1 each; Refill: 0 - guaiFENesin-codeine 100-10 MG/5ML syrup; Take 5 mLs by mouth every 6 (six) hours as needed for cough.  Dispense: 100 mL; Refill: 0 - predniSONE (DELTASONE) 20 MG tablet; Take 2 tablets (40 mg total) by mouth daily with breakfast.  Dispense: 10 tablet; Refill: 0  3. Short of breath on exertion - albuterol (VENTOLIN HFA) 108 (90 Base) MCG/ACT inhaler; Inhale 2 puffs into the lungs every 4 (four) hours as needed.  Dispense: 1 each; Refill: 0 - guaiFENesin-codeine 100-10 MG/5ML syrup; Take 5 mLs by mouth every 6 (six) hours as needed for cough.  Dispense: 100 mL; Refill: 0 - predniSONE (DELTASONE) 20 MG tablet; Take 2 tablets (40 mg total) by mouth daily with breakfast.  Dispense: 10 tablet; Refill: 0   Pt with current dx of covid illness, on day 10 after symptoms started.  She is improving.  Gave albuterol inhaler, prednisone, and cheratussin to help with coughing. Increase fluids and take tylenol or ibuprofen for pain.   F/u if not improving pt to call in 2 days if not improving or worsening.  Pt doesn't have insurance, would like to hold off on xray, and try meds first, if not improving would send her for xray. VS stable. Gave meds to help with coughing/sob.   Pt in agreement.  F/u prn.

## 2020-05-18 ENCOUNTER — Encounter: Payer: Self-pay | Admitting: Family Medicine

## 2020-05-18 DIAGNOSIS — U071 COVID-19: Secondary | ICD-10-CM | POA: Insufficient documentation

## 2020-07-22 ENCOUNTER — Encounter: Payer: Self-pay | Admitting: Family Medicine

## 2020-07-22 ENCOUNTER — Ambulatory Visit (INDEPENDENT_AMBULATORY_CARE_PROVIDER_SITE_OTHER): Payer: Self-pay | Admitting: Family Medicine

## 2020-07-22 VITALS — BP 128/86 | HR 98 | Temp 97.8°F | Ht 64.5 in | Wt 167.0 lb

## 2020-07-22 DIAGNOSIS — F411 Generalized anxiety disorder: Secondary | ICD-10-CM

## 2020-07-22 DIAGNOSIS — R030 Elevated blood-pressure reading, without diagnosis of hypertension: Secondary | ICD-10-CM | POA: Insufficient documentation

## 2020-07-22 MED ORDER — HYDROXYZINE PAMOATE 50 MG PO CAPS: ORAL_CAPSULE | ORAL | 0 refills | Status: DC
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
## 2020-07-22 NOTE — Patient Instructions (Addendum)
Take blood pressure once per day and keep log. If your blood pressure is consistently > 130/80 let me know.  Follow-up in 1 month.    DASH Eating Plan DASH stands for "Dietary Approaches to Stop Hypertension." The DASH eating plan is a healthy eating plan that has been shown to reduce high blood pressure (hypertension). It may also reduce your risk for type 2 diabetes, heart disease, and stroke. The DASH eating plan may also help with weight loss. What are tips for following this plan?  General guidelines  Avoid eating more than 2,300 mg (milligrams) of salt (sodium) a day. If you have hypertension, you may need to reduce your sodium intake to 1,500 mg a day.  Limit alcohol intake to no more than 1 drink a day for nonpregnant women and 2 drinks a day for men. One drink equals 12 oz of beer, 5 oz of wine, or 1 oz of hard liquor.  Work with your health care provider to maintain a healthy body weight or to lose weight. Ask what an ideal weight is for you.  Get at least 30 minutes of exercise that causes your heart to beat faster (aerobic exercise) most days of the week. Activities may include walking, swimming, or biking.  Work with your health care provider or diet and nutrition specialist (dietitian) to adjust your eating plan to your individual calorie needs. Reading food labels   Check food labels for the amount of sodium per serving. Choose foods with less than 5 percent of the Daily Value of sodium. Generally, foods with less than 300 mg of sodium per serving fit into this eating plan.  To find whole grains, look for the word "whole" as the first word in the ingredient list. Shopping  Buy products labeled as "low-sodium" or "no salt added."  Buy fresh foods. Avoid canned foods and premade or frozen meals. Cooking  Avoid adding salt when cooking. Use salt-free seasonings or herbs instead of table salt or sea salt. Check with your health care provider or pharmacist before using  salt substitutes.  Do not fry foods. Cook foods using healthy methods such as baking, boiling, grilling, and broiling instead.  Cook with heart-healthy oils, such as olive, canola, soybean, or sunflower oil. Meal planning  Eat a balanced diet that includes: ? 5 or more servings of fruits and vegetables each day. At each meal, try to fill half of your plate with fruits and vegetables. ? Up to 6-8 servings of whole grains each day. ? Less than 6 oz of lean meat, poultry, or fish each day. A 3-oz serving of meat is about the same size as a deck of cards. One egg equals 1 oz. ? 2 servings of low-fat dairy each day. ? A serving of nuts, seeds, or beans 5 times each week. ? Heart-healthy fats. Healthy fats called Omega-3 fatty acids are found in foods such as flaxseeds and coldwater fish, like sardines, salmon, and mackerel.  Limit how much you eat of the following: ? Canned or prepackaged foods. ? Food that is high in trans fat, such as fried foods. ? Food that is high in saturated fat, such as fatty meat. ? Sweets, desserts, sugary drinks, and other foods with added sugar. ? Full-fat dairy products.  Do not salt foods before eating.  Try to eat at least 2 vegetarian meals each week.  Eat more home-cooked food and less restaurant, buffet, and fast food.  When eating at a restaurant, ask that your food  be prepared with less salt or no salt, if possible. What foods are recommended? The items listed may not be a complete list. Talk with your dietitian about what dietary choices are best for you. Grains Whole-grain or whole-wheat bread. Whole-grain or whole-wheat pasta. Brown rice. [place]. Bulgur. Whole-grain and low-sodium cereals. Pita bread. Low-fat, low-sodium crackers. Whole-wheat flour tortillas. Vegetables Fresh or frozen vegetables (raw, steamed, roasted, or grilled). Low-sodium or reduced-sodium tomato and vegetable juice. Low-sodium or reduced-sodium tomato sauce and  tomato paste. Low-sodium or reduced-sodium canned vegetables. Fruits All fresh, dried, or frozen fruit. Canned fruit in natural juice (without added sugar). Meat and other protein foods Skinless chicken or [place]. Ground chicken or [place]. Pork with fat trimmed off. Fish and seafood. Egg whites. Dried beans, peas, or lentils. Unsalted nuts, nut butters, and seeds. Unsalted canned beans. Lean cuts of beef with fat trimmed off. Low-sodium, lean deli meat. Dairy Low-fat (1%) or fat-free (skim) milk. Fat-free, low-fat, or reduced-fat cheeses. Nonfat, low-sodium ricotta or cottage cheese. Low-fat or nonfat yogurt. Low-fat, low-sodium cheese. Fats and oils Soft margarine without trans fats. Vegetable oil. Low-fat, reduced-fat, or light mayonnaise and salad dressings (reduced-sodium). Canola, safflower, olive, soybean, and sunflower oils. Avocado. Seasoning and other foods Herbs. Spices. Seasoning mixes without salt. Unsalted popcorn and pretzels. Fat-free sweets. What foods are not recommended? The items listed may not be a complete list. Talk with your dietitian about what dietary choices are best for you. Grains Baked goods made with fat, such as croissants, muffins, or some breads. Dry pasta or rice meal packs. Vegetables Creamed or fried vegetables. Vegetables in a cheese sauce. Regular canned vegetables (not low-sodium or reduced-sodium). Regular canned tomato sauce and paste (not low-sodium or reduced-sodium). Regular tomato and vegetable juice (not low-sodium or reduced-sodium). Angie Fava. Olives. Fruits Canned fruit in a light or heavy syrup. Fried fruit. Fruit in cream or butter sauce. Meat and other protein foods Fatty cuts of meat. Ribs. Fried meat. Berniece Salines. Sausage. Bologna and other processed lunch meats. Salami. Fatback. Hotdogs. Bratwurst. Salted nuts and seeds. Canned beans with added salt. Canned or smoked fish. Whole eggs or egg yolks. Chicken or [place] with skin. Dairy Whole or 2%  milk, cream, and half-and-half. Whole or full-fat cream cheese. Whole-fat or sweetened yogurt. Full-fat cheese. Nondairy creamers. Whipped toppings. Processed cheese and cheese spreads. Fats and oils Butter. Stick margarine. Lard. Shortening. Ghee. Bacon fat. Tropical oils, such as coconut, palm kernel, or palm oil. Seasoning and other foods Salted popcorn and pretzels. Onion salt, garlic salt, seasoned salt, table salt, and sea salt. Worcestershire sauce. Tartar sauce. Barbecue sauce. Teriyaki sauce. Soy sauce, including reduced-sodium. Steak sauce. Canned and packaged gravies. Fish sauce. Oyster sauce. Cocktail sauce. Horseradish that you find on the shelf. Ketchup. Mustard. Meat flavorings and tenderizers. Bouillon cubes. Hot sauce and Tabasco sauce. Premade or packaged marinades. Premade or packaged taco seasonings. Relishes. Regular salad dressings. Where to find more information:  National Heart, Lung, and [place]: https://wilson-eaton.com/  American Heart Association: www.heart.org Summary  The DASH eating plan is a healthy eating plan that has been shown to reduce high blood pressure (hypertension). It may also reduce your risk for type 2 diabetes, heart disease, and stroke.  With the DASH eating plan, you should limit salt (sodium) intake to 2,300 mg a day. If you have hypertension, you may need to reduce your sodium intake to 1,500 mg a day.  When on the DASH eating plan, aim to eat more fresh fruits and vegetables, whole  grains, lean proteins, low-fat dairy, and heart-healthy fats.  Work with your health care provider or diet and nutrition specialist (dietitian) to adjust your eating plan to your individual calorie needs. This information is not intended to replace advice given to you by your health care provider. Make sure you discuss any questions you have with your health care provider. Document Revised: 08/19/2017 Document Reviewed: 08/30/2016 Elsevier Patient Education  2020  [employer].

## 2020-07-22 NOTE — Addendum Note (Signed)
Addended by: Metro Kung on: 07/22/2020 11:06 AM   Modules accepted: Orders

## 2020-09-17 ENCOUNTER — Other Ambulatory Visit: Payer: Self-pay | Admitting: Family Medicine

## 2020-09-17 DIAGNOSIS — F411 Generalized anxiety disorder: Secondary | ICD-10-CM

## 2021-07-21 DIAGNOSIS — Z01419 Encounter for gynecological examination (general) (routine) without abnormal findings: Secondary | ICD-10-CM | POA: Diagnosis not present

## 2021-11-04 ENCOUNTER — Encounter: Payer: Self-pay | Admitting: Family Medicine

## 2021-11-16 ENCOUNTER — Other Ambulatory Visit: Payer: Self-pay | Admitting: Family Medicine

## 2021-12-10 ENCOUNTER — Encounter: Payer: Self-pay | Admitting: Family Medicine

## 2021-12-10 ENCOUNTER — Ambulatory Visit (INDEPENDENT_AMBULATORY_CARE_PROVIDER_SITE_OTHER): Payer: Self-pay | Admitting: Family Medicine

## 2021-12-10 VITALS — BP 149/93 | HR 78 | Temp 98.5°F | Ht 64.5 in | Wt 180.5 lb

## 2021-12-10 DIAGNOSIS — Z8632 Personal history of gestational diabetes: Secondary | ICD-10-CM

## 2021-12-10 DIAGNOSIS — E669 Obesity, unspecified: Secondary | ICD-10-CM | POA: Insufficient documentation

## 2021-12-10 DIAGNOSIS — R030 Elevated blood-pressure reading, without diagnosis of hypertension: Secondary | ICD-10-CM

## 2021-12-10 DIAGNOSIS — F411 Generalized anxiety disorder: Secondary | ICD-10-CM

## 2021-12-10 DIAGNOSIS — F331 Major depressive disorder, recurrent, moderate: Secondary | ICD-10-CM | POA: Insufficient documentation

## 2021-12-10 DIAGNOSIS — R109 Unspecified abdominal pain: Secondary | ICD-10-CM

## 2021-12-10 LAB — URINALYSIS, ROUTINE W REFLEX MICROSCOPIC
Bilirubin, UA: NEGATIVE
Glucose, UA: NEGATIVE
Ketones, UA: NEGATIVE
Leukocytes,UA: NEGATIVE
Nitrite, UA: NEGATIVE
Protein,UA: NEGATIVE
RBC, UA: NEGATIVE
Specific Gravity, UA: <1.005 — ABNORMAL LOW (ref 1.005–1.030)
Urobilinogen, Ur: 0.2 mg/dL (ref 0.2–1.0)
pH, UA: 6.5 (ref 5.0–7.5)

## 2021-12-10 MED ORDER — ESCITALOPRAM OXALATE 10 MG PO TABS: 10.0000 mg | ORAL_TABLET | Freq: Every day | ORAL | 11 refills | Status: DC

## 2021-12-10 NOTE — Patient Instructions (Signed)
How to Take Your Blood Pressure ?Blood pressure is a measurement of how strongly your blood is pressing against the walls of your arteries. Arteries are blood vessels that carry blood from your heart throughout your body. Your health care provider takes your blood pressure at each office visit. You can also take your own blood pressure at home with a blood pressure monitor. ?You may need to take your own blood pressure to: ?Confirm a diagnosis of high blood pressure (hypertension). ?Monitor your blood pressure over time. ?Make sure your blood pressure medicine is working. ?Supplies needed: ?Blood pressure monitor. ?Dining room chair to sit in. ?Table or desk. ?Small notebook and pencil or pen. ?How to prepare ?To get the most accurate reading, avoid the following for 30 minutes before you check your blood pressure: ?Drinking caffeine. ?Drinking alcohol. ?Eating. ?Smoking. ?Exercising. ?Five minutes before you check your blood pressure: ?Use the bathroom and urinate so that you have an empty bladder. ?Sit quietly in a dining room chair. Do not sit in a soft couch or an armchair. Do not talk. ?How to take your blood pressure ?To check your blood pressure, follow the instructions in the manual that came with your blood pressure monitor. If you have a digital blood pressure monitor, the instructions may be as follows: ?Sit up straight in a chair. ?Place your feet on the floor. Do not cross your ankles or legs. ?Rest your left arm at the level of your heart on a table or desk or on the arm of a chair. ?Pull up your shirt sleeve. ?Wrap the blood pressure cuff around the upper part of your left arm, 1 inch (2.5 cm) above your elbow. It is best to wrap the cuff around bare skin. ?Fit the cuff snugly around your arm. You should be able to place only one finger between the cuff and your arm. ?Position the cord so that it rests in the bend of your elbow. ?Press the power button. ?Sit quietly while the cuff inflates and  deflates. ?Read the digital reading on the monitor screen and write the numbers down (record them) in a notebook. ?Wait 2-3 minutes, then repeat the steps, starting at step 1. ?What does my blood pressure reading mean? ?A blood pressure reading consists of a higher number over a lower number. Ideally, your blood pressure should be below 120/80. The first ("top") number is called the systolic pressure. It is a measure of the pressure in your arteries as your heart beats. The second ("bottom") number is called the diastolic pressure. It is a measure of the pressure in your arteries as the heart relaxes. ?Blood pressure is classified into five stages. The following are the stages for adults who do not have a short-term serious illness or a chronic condition. Systolic pressure and diastolic pressure are measured in a unit called mm Hg (millimeters of mercury).  ?Normal ?Systolic pressure: below 120. ?Diastolic pressure: below 80. ?Elevated ?Systolic pressure: 120-129. ?Diastolic pressure: below 80. ?Hypertension stage 1 ?Systolic pressure: 130-139. ?Diastolic pressure: 80-89. ?Hypertension stage 2 ?Systolic pressure: 140 or above. ?Diastolic pressure: 90 or above. ?You can have elevated blood pressure or hypertension even if only the systolic or only the diastolic number in your reading is higher than normal. ?Follow these instructions at home: ?Medicines ?Take over-the-counter and prescription medicines only as told by your health care provider. ?Tell your health care provider if you are having any side effects from blood pressure medicine. ?General instructions ?Check your blood pressure as often as   recommended by your health care provider. ?Check your blood pressure at the same time every day. ?Take your monitor to the next appointment with your health care provider to make sure that: ?You are using it correctly. ?It provides accurate readings. ?Understand what your goal blood pressure numbers are. ?Keep all  follow-up visits as told by your health care provider. This is important. ?General tips ?Your health care provider can suggest a reliable monitor that will meet your needs. There are several types of home blood pressure monitors. ?Choose a monitor that has an arm cuff. Do not choose a monitor that measures your blood pressure from your wrist or finger. ?Choose a cuff that wraps snugly around your upper arm. You should be able to fit only one finger between your arm and the cuff. ?You can buy a blood pressure monitor at most drugstores or online. ?Where to find more information ?American Heart Association: www.heart.org ?Contact a health care provider if: ?Your blood pressure is consistently high. ?Your blood pressure is suddenly low. ?Get help right away if: ?Your systolic blood pressure is higher than 180. ?Your diastolic blood pressure is higher than 120. ?Summary ?Blood pressure is a measurement of how strongly your blood is pressing against the walls of your arteries. ?A blood pressure reading consists of a higher number over a lower number. Ideally, your blood pressure should be below 120/80. ?Check your blood pressure at the same time every day. ?Avoid caffeine, alcohol, smoking, and exercise for 30 minutes prior to checking your blood pressure. These agents can affect the accuracy of the blood pressure reading. ?This information is not intended to replace advice given to you by your health care provider. Make sure you discuss any questions you have with your health care provider. ?Document Revised: 07/16/2020 Document Reviewed: 08/31/2019 ?Elsevier Patient Education ? 2022 Elsevier Inc[employer] ? ?

## 2021-12-10 NOTE — Progress Notes (Signed)
? ?New Patient Office Visit ? ?Subjective:  ?Patient ID: Lauren [name], female    DOB: November 16, 1980  Age: 41 y.o. MRN: [id number] ? ?CC:  ?Chief Complaint  ?Patient presents with  ? New Patient (Initial Visit)  ? ? ?HPI ?Lauren [name] presents to establish care. She has a few concerns today.  ? ?She reports intermittent flank pain. Often she notices this on the days that she isn't as well hydrated. She denies dysuria, fever, chills, hematuria, frequency, or urgency.  ? ?She has had difficulty losing weight despite eating well balanced diet. She is intermittently exercising. She also reports dry hair, skin, and nails. She is also having difficulty staying asleep and then going back to sleep when she wakes. She reports also fatigue. ? ?She has a history of anxiety and depression. She used to take lexapro with good results. She has not had in over a year.  ? ?She was checking her BP a few months ago and was getting readings of 1305-140s. She has not check her BP recently. She denies a dx of HTN.  ? ?She would like to have fasting labs.  ? ? ?  12/10/2021  ? 10:54 AM 07/22/2020  ? 10:11 AM 10/05/2018  ?  8:13 AM  ?Depression screen PHQ 2/9  ?Decreased Interest 1 0 0  ?Down, Depressed, Hopeless 1 0 1  ?PHQ - 2 Score 2 0 1  ?Altered sleeping 2 1 0  ?Tired, decreased energy 2 1 1   ?Change in appetite 2 2 0  ?Feeling bad or failure about yourself  0 0 0  ?Trouble concentrating 1 1 0  ?Moving slowly or fidgety/restless 0 0 0  ?Suicidal thoughts 0 0 0  ?PHQ-9 Score 9 5 2   ?Difficult doing work/chores Somewhat difficult Not difficult at all Not difficult at all  ? ? ?  12/10/2021  ? 10:55 AM 07/22/2020  ? 10:11 AM 10/05/2018  ?  8:13 AM 05/23/2018  ?  9:18 AM  ?GAD 7 : Generalized Anxiety Score  ?Nervous, Anxious, on Edge 1 2 0 1  ?Control/stop worrying 1 2 1  0  ?Worry too much - different things 0 1 0 0  ?Trouble relaxing 1 3 1 1   ?Restless 1 1 0 0  ?Easily annoyed or irritable 1 1 1 1   ?Afraid - awful might happen 0 0 0 0  ?Total  GAD 7 Score 5 10 3 3   ?Anxiety Difficulty Not difficult at all Somewhat difficult Not difficult at all Not difficult at all  ? ? ? ? ?Past Medical History:  ?Diagnosis Date  ? Anemia   ? Anxiety   ? no meds currently  ? Diabetes mellitus without complication ([place])   ? Gestational  ? Hx of preterm delivery, currently pregnant   ? Hx of varicella   ? Kidney stone 09/20/2012  ? Postpartum depression   ? took medication for short time  ? ? ?Past Surgical History:  ?Procedure Laterality Date  ? LITHOTRIPSY    ? ? ?Family History  ?Problem Relation Age of Onset  ? Stroke Mother   ? Hypertension Mother   ? Hyperlipidemia Mother   ? Diabetes Mother   ? Hearing loss Father   ? Heart disease Father   ? Anxiety disorder Daughter   ? Alcohol abuse Maternal Grandfather   ? Heart disease Maternal Grandfather   ? Heart attack Paternal Grandmother   ? Heart failure Paternal Grandmother   ? Diabetes Paternal Grandmother   ?  Obesity Paternal Grandmother   ? Heart disease Paternal Grandmother   ? Cancer Paternal Grandmother   ?     throat  ? Heart failure Paternal Grandfather   ? Hearing loss Paternal Grandfather   ? Heart disease Paternal Grandfather   ? Heart attack Paternal Grandfather   ? ? ?Social History  ? ?Socioeconomic History  ? Marital status: Divorced  ?  Spouse name: Not on file  ? Number of children: 3  ? Years of education: 86  ? Highest education level: Some college, no degree  ?Occupational History  ? Not on file  ?Tobacco Use  ? Smoking status: Never  ? Smokeless tobacco: Never  ?Vaping Use  ? Vaping Use: Never used  ?Substance and Sexual Activity  ? Alcohol use: Yes  ?  Alcohol/week: 2.0 standard drinks  ?  Types: 2 Cans of beer per week  ? Drug use: No  ? Sexual activity: Yes  ?  Birth control/protection: Pill  ?Other Topics Concern  ? Not on file  ?Social History Narrative  ? Not on file  ? ?Social Determinants of Health  ? ?Financial Resource Strain: Not on file  ?Food Insecurity: Not on file  ?Transportation  Needs: Not on file  ?Physical Activity: Not on file  ?Stress: Not on file  ?Social Connections: Not on file  ?Intimate Partner Violence: Not on file  ? ? ?ROS ?Review of Systems ?Negative unless specially indicated above in HPI. ? ?Objective:  ? ?Today's Vitals: BP (!) 153/97   Pulse 82   Temp 98.5 ?F (36.9 ?C) (Temporal)   Ht 5' 4.5" (1.638 m)   Wt 180 lb 8 oz (81.9 kg)   BMI 30.50 kg/m?  ? ?Physical Exam ?Vitals and nursing note reviewed.  ?Constitutional:   ?   General: She is not in acute distress. ?   Appearance: She is not ill-appearing, toxic-appearing or diaphoretic.  ?HENT:  ?   Head: Normocephalic and atraumatic.  ?   Right Ear: Tympanic membrane, ear canal and external ear normal.  ?   Left Ear: Tympanic membrane, ear canal and external ear normal.  ?   Nose: Nose normal.  ?   Mouth/Throat:  ?   Mouth: Mucous membranes are moist.  ?Eyes:  ?   Extraocular Movements: Extraocular movements intact.  ?   Conjunctiva/sclera: Conjunctivae normal.  ?   Pupils: Pupils are equal, round, and reactive to light.  ?Neck:  ?   Thyroid: No thyroid mass, thyromegaly or thyroid tenderness.  ?   Vascular: No JVD.  ?Cardiovascular:  ?   Rate and Rhythm: Normal rate and regular rhythm.  ?   Heart sounds: Normal heart sounds. No murmur heard. ?Pulmonary:  ?   Effort: Pulmonary effort is normal. No respiratory distress.  ?   Breath sounds: Normal breath sounds.  ?Abdominal:  ?   General: Bowel sounds are normal. There is no distension.  ?   Palpations: Abdomen is soft.  ?   Tenderness: There is no abdominal tenderness. There is no right CVA tenderness, left CVA tenderness, guarding or rebound.  ?Musculoskeletal:  ?   Right lower leg: No edema.  ?   Left lower leg: No edema.  ?Skin: ?   General: Skin is warm and dry.  ?Neurological:  ?   General: No focal deficit present.  ?   Mental Status: She is alert and oriented to person, place, and time.  ?Psychiatric:     ?   Mood and  Affect: Mood normal.     ?   Behavior: Behavior  normal.  ? ? ?Assessment & Plan:  ? ?Oneda was seen today for new patient (initial visit). ? ?Diagnoses and all orders for this visit: ? ?Flank pain ?Negative UA. Benign exam. Discussed hydration and return precautions.  ?-     Urinalysis, Routine w reflex microscopic ? ?Elevated blood pressure reading in office without diagnosis of hypertension ?Fasting labs pending. BP log given. Will reassess at next visit.  ?-     CBC with Differential/Platelet; Future ?-     CMP14+EGFR; Future ?-     Lipid panel; Future ?-     Thyroid Panel With TSH; Future ? ?Generalized anxiety disorder ?GAD score of 5 today. Restart lexapro as below.  ?-     escitalopram (LEXAPRO) 10 MG tablet; Take 1 tablet (10 mg total) by mouth daily. ? ?Depression, major, recurrent, moderate ([place]) ?Not well controlled. Denies SI. Restart lexapro.  ?-     escitalopram (LEXAPRO) 10 MG tablet; Take 1 tablet (10 mg total) by mouth daily. ? ?Obesity (BMI 30-39.9) ?BMI 30.5 today. Labs pending. Diet and exercise. ?-     CBC with Differential/Platelet; Future ?-     CMP14+EGFR; Future ?-     Lipid panel; Future ?-     Thyroid Panel With TSH; Future ? ?Hx of gestational diabetes mellitus, not currently pregnant ?A1c pending.  ?-     Bayer DCA Hb A1c Waived; Future ? ? ?Follow-up: Return in about 6 weeks (around 01/21/2022) for BP, weight, medication follow up. ? ?The patient indicates understanding of these issues and agrees with the plan.  ? ?Gwenlyn Perking, FNP ? ?

## 2021-12-14 ENCOUNTER — Other Ambulatory Visit: Payer: Self-pay | Admitting: Family Medicine

## 2021-12-15 LAB — COMPREHENSIVE METABOLIC PANEL
ALT: 26 IU/L (ref 0–32)
AST: 28 IU/L (ref 0–40)
Albumin/Globulin Ratio: 1.5 (ref 1.2–2.2)
Albumin: 4.1 g/dL (ref 3.8–4.8)
Alkaline Phosphatase: 75 IU/L (ref 44–121)
BUN/Creatinine Ratio: 14 (ref 9–23)
BUN: 14 mg/dL (ref 6–24)
Bilirubin Total: 0.2 mg/dL (ref 0.0–1.2)
CO2: 24 mmol/L (ref 20–29)
Calcium: 9.7 mg/dL (ref 8.7–10.2)
Chloride: 101 mmol/L (ref 96–106)
Creatinine, Ser: 0.97 mg/dL (ref 0.57–1.00)
Globulin, Total: 2.7 g/dL (ref 1.5–4.5)
Glucose: 100 mg/dL — ABNORMAL HIGH (ref 70–99)
Potassium: 4.4 mmol/L (ref 3.5–5.2)
Sodium: 139 mmol/L (ref 134–144)
Total Protein: 6.8 g/dL (ref 6.0–8.5)
eGFR: 75 mL/min/{1.73_m2} (ref >59)

## 2021-12-15 LAB — THYROID PANEL WITH TSH
Free Thyroxine Index: 2.2 (ref 1.2–4.9)
T3 Uptake Ratio: 22 % — ABNORMAL LOW (ref 24–39)
T4, Total: 9.9 ug/dL (ref 4.5–12.0)
TSH: 1.83 u[IU]/mL (ref 0.450–4.500)

## 2021-12-15 LAB — LIPID PANEL W/O CHOL/HDL RATIO
Cholesterol, Total: 236 mg/dL — ABNORMAL HIGH (ref 100–199)
HDL: 55 mg/dL (ref >39)
LDL Chol Calc (NIH): 145 mg/dL — ABNORMAL HIGH (ref 0–99)
Triglycerides: 201 mg/dL — ABNORMAL HIGH (ref 0–149)
VLDL Cholesterol Cal: 36 mg/dL (ref 5–40)

## 2021-12-15 LAB — CBC WITH DIFFERENTIAL/PLATELET
Basophils Absolute: 0.1 10*3/uL (ref 0.0–0.2)
Basos: 1 %
EOS (ABSOLUTE): 0.3 10*3/uL (ref 0.0–0.4)
Eos: 3 %
Hematocrit: 41.8 % (ref 34.0–46.6)
Hemoglobin: 14.2 g/dL (ref 11.1–15.9)
Immature Grans (Abs): 0 10*3/uL (ref 0.0–0.1)
Immature Granulocytes: 0 %
Lymphocytes Absolute: 2 10*3/uL (ref 0.7–3.1)
Lymphs: 24 %
MCH: 30.1 pg (ref 26.6–33.0)
MCHC: 34 g/dL (ref 31.5–35.7)
MCV: 89 fL (ref 79–97)
Monocytes Absolute: 0.3 10*3/uL (ref 0.1–0.9)
Monocytes: 4 %
Neutrophils Absolute: 5.5 10*3/uL (ref 1.4–7.0)
Neutrophils: 68 %
Platelets: 317 10*3/uL (ref 150–450)
RBC: 4.72 x10E6/uL (ref 3.77–5.28)
RDW: 11.8 % (ref 11.7–15.4)
WBC: 8.2 10*3/uL (ref 3.4–10.8)

## 2021-12-15 LAB — HGB A1C W/O EAG: Hgb A1c MFr Bld: 5.8 % — ABNORMAL HIGH (ref 4.8–5.6)

## 2021-12-24 ENCOUNTER — Encounter: Payer: Self-pay | Admitting: Family Medicine

## 2022-01-21 ENCOUNTER — Ambulatory Visit: Payer: Medicaid Other | Admitting: Family Medicine

## 2022-03-31 ENCOUNTER — Encounter: Payer: Self-pay | Admitting: Family Medicine

## 2022-06-30 ENCOUNTER — Ambulatory Visit (INDEPENDENT_AMBULATORY_CARE_PROVIDER_SITE_OTHER): Payer: Self-pay | Admitting: Family Medicine

## 2022-06-30 VITALS — BP 148/97 | HR 85 | Temp 97.9°F | Ht 64.5 in | Wt 180.2 lb

## 2022-06-30 DIAGNOSIS — R109 Unspecified abdominal pain: Secondary | ICD-10-CM

## 2022-06-30 LAB — POCT URINALYSIS DIP (MANUAL ENTRY)
Bilirubin, UA: NEGATIVE
Blood, UA: NEGATIVE
Glucose, UA: NEGATIVE mg/dL
Ketones, POC UA: NEGATIVE mg/dL
Leukocytes, UA: NEGATIVE
Nitrite, UA: NEGATIVE
Protein Ur, POC: NEGATIVE mg/dL
Spec Grav, UA: <=1.005 — AB (ref 1.010–1.025)
Urobilinogen, UA: 0.2 E.U./dL
pH, UA: 6.5 (ref 5.0–8.0)

## 2022-06-30 LAB — POCT URINE PREGNANCY: Preg Test, Ur: NEGATIVE

## 2022-06-30 MED ORDER — TAMSULOSIN HCL 0.4 MG PO CAPS: 0.4000 mg | ORAL_CAPSULE | Freq: Every day | ORAL | 0 refills | Status: DC

## 2022-06-30 MED ORDER — TRAMADOL HCL 50 MG PO TABS: 50.0000 mg | ORAL_TABLET | Freq: Three times a day (TID) | ORAL | 0 refills | Status: DC | PRN

## 2022-06-30 NOTE — Patient Instructions (Signed)
Lots of fluids.  Medications as directed.  Call with concerns/if you worsen.  Consider imaging if this persists.  Take care  Dr. Lacinda Axon

## 2022-07-01 DIAGNOSIS — R109 Unspecified abdominal pain: Secondary | ICD-10-CM | POA: Insufficient documentation

## 2022-07-01 NOTE — Progress Notes (Signed)
Subjective:  Patient ID: Lauren [name], female    DOB: 08-10-1981  Age: 41 y.o. MRN: [id number]  CC: Flank pain  HPI:  41 year old female with a history of kidney stone presents with left flank pain.  Started yesterday.  Left flank pain.  She is also had pressure in the bladder area.  She has a history of kidney stones and therefore is concerned that she has a kidney stone again.  No reports of severe pain.  Patient is not in any distress currently.  Reports of hematuria.  Has been taking Tylenol and Ibuprofen without complete resolution.  Denies fever.  No nausea vomiting.  No other associated symptoms.  No other complaints.  Patient Active Problem List   Diagnosis Date Noted   Left flank pain 07/01/2022   Depression, major, recurrent, moderate ([facility]) 12/10/2021   Obesity (BMI 30-39.9) 12/10/2021   Generalized anxiety disorder 07/22/2020   Elevated blood pressure reading in office without diagnosis of hypertension 07/22/2020   Cough variant asthma 04/15/2017    Social Hx   Social History   Socioeconomic History   Marital status: Divorced    Spouse name: Not on file   Number of children: 3   Years of education: 14   Highest education level: Some college, no degree  Occupational History   Not on file  Tobacco Use   Smoking status: Never   Smokeless tobacco: Never  Vaping Use   Vaping Use: Never used  Substance and Sexual Activity   Alcohol use: Yes    Alcohol/week: 2.0 standard drinks of alcohol    Types: 2 Cans of beer per week   Drug use: No   Sexual activity: Yes    Birth control/protection: Pill  Other Topics Concern   Not on file  Social History Narrative   Not on file   Social Determinants of Health   Financial Resource Strain: Not on file  Food Insecurity: Not on file  Transportation Needs: Not on file  Physical Activity: Not on file  Stress: Not on file  Social Connections: Not on file    Review of Systems Per HPI  Objective:  BP (!) 148/97    Pulse 85   Temp 97.9 F (36.6 C)   Ht 5' 4.5" (1.638 m)   Wt 180 lb 3.2 oz (81.7 kg)   SpO2 98%   BMI 30.45 kg/m      06/30/2022   10:16 AM 12/10/2021   11:37 AM 12/10/2021   10:33 AM  BP/Weight  Systolic BP 148 149 153  Diastolic BP 97 93 97  Wt. (Lbs) 180.2  180.5  BMI 30.45 kg/m2  30.5 kg/m2    Physical Exam Constitutional:      General: She is not in acute distress.    Appearance: Normal appearance.  HENT:     Head: Normocephalic and atraumatic.  Cardiovascular:     Rate and Rhythm: Normal rate and regular rhythm.  Pulmonary:     Effort: Pulmonary effort is normal.     Breath sounds: Normal breath sounds.  Abdominal:     General: There is no distension.     Palpations: Abdomen is soft.     Tenderness: There is no abdominal tenderness. There is no right CVA tenderness or left CVA tenderness.  Neurological:     Mental Status: She is alert.  Psychiatric:        Mood and Affect: Mood normal.        Behavior: Behavior normal.  Lab Results  Component Value Date   WBC 8.2 12/14/2021   HGB 14.2 12/14/2021   HCT 41.8 12/14/2021   PLT 317 12/14/2021   GLUCOSE 100 (H) 12/14/2021   CHOL 236 (H) 12/14/2021   TRIG 201 (H) 12/14/2021   HDL 55 12/14/2021   LDLCALC 145 (H) 12/14/2021   ALT 26 12/14/2021   AST 28 12/14/2021   NA 139 12/14/2021   K 4.4 12/14/2021   CL 101 12/14/2021   CREATININE 0.97 12/14/2021   BUN 14 12/14/2021   CO2 24 12/14/2021   TSH 1.830 12/14/2021   HGBA1C 5.8 (H) 12/14/2021     Assessment & Plan:   Problem List Items Addressed This Visit       Other   Left flank pain - Primary    No CVA tenderness.  Possible kidney stone given patient's history.  However, the fact that her urinalysis was normal goes against this.  She does not have insurance.  Discussed CT imaging versus KUB.  Patient wants to just treat as if this were kidney stone and if symptoms persist or worsen she will follow-up.  Tramadol as needed for pain.  Starting on  Flomax.  Aggressive hydration.      Relevant Orders   POCT urinalysis dipstick (Completed)   POCT urine pregnancy (Completed)    Meds ordered this encounter  Medications   tamsulosin (FLOMAX) 0.4 MG CAPS capsule    Sig: Take 1 capsule (0.4 mg total) by mouth daily.    Dispense:  14 capsule    Refill:  0   traMADol (ULTRAM) 50 MG tablet    Sig: Take 1-2 tablets (50-100 mg total) by mouth every 8 (eight) hours as needed for moderate pain or severe pain.    Dispense:  15 tablet    Refill:  0   [place] controlled substance database reviewed today.   Follow-up:  If fails to improve or worsens.  [facility]an

## 2022-07-01 NOTE — Assessment & Plan Note (Signed)
No CVA tenderness.  Possible kidney stone given patient's history.  However, the fact that her urinalysis was normal goes against this.  She does not have insurance.  Discussed CT imaging versus KUB.  Patient wants to just treat as if this were kidney stone and if symptoms persist or worsen she will follow-up.  Tramadol as needed for pain.  Starting on Flomax.  Aggressive hydration.

## 2022-07-02 ENCOUNTER — Encounter: Payer: Self-pay | Admitting: Family Medicine

## 2022-07-05 ENCOUNTER — Other Ambulatory Visit: Payer: Self-pay | Admitting: Family Medicine

## 2022-07-05 DIAGNOSIS — N200 Calculus of kidney: Secondary | ICD-10-CM

## 2022-07-05 DIAGNOSIS — R109 Unspecified abdominal pain: Secondary | ICD-10-CM

## 2022-07-12 ENCOUNTER — Other Ambulatory Visit: Payer: Self-pay | Admitting: Family Medicine

## 2022-09-30 ENCOUNTER — Ambulatory Visit (HOSPITAL_COMMUNITY): Payer: Self-pay

## 2022-11-16 ENCOUNTER — Encounter: Payer: Self-pay | Admitting: Family Medicine

## 2022-11-16 ENCOUNTER — Ambulatory Visit (HOSPITAL_COMMUNITY)
Admission: RE | Admit: 2022-11-16 | Discharge: 2022-11-16 | Disposition: A | Discharge status: Home / Self Care | Payer: Medicaid Other | Source: Ambulatory Visit | Attending: Family Medicine | Admitting: Family Medicine

## 2022-11-16 DIAGNOSIS — R109 Unspecified abdominal pain: Secondary | ICD-10-CM | POA: Insufficient documentation

## 2022-11-16 NOTE — Addendum Note (Signed)
Addended by: Dairl Ponder on: 11/16/2022 01:56 PM   Modules accepted: Orders

## 2022-11-16 NOTE — Telephone Encounter (Signed)
Results discussed with patient. Patient advised per Dr Lacinda Axon: Staghorn type stone noted. Needs referral to urology Patient verbalized understanding and wants referral to urology in [place]. Referral ordered in [facility].

## 2022-11-30 DIAGNOSIS — N200 Calculus of kidney: Secondary | ICD-10-CM | POA: Diagnosis not present

## 2022-12-01 ENCOUNTER — Other Ambulatory Visit: Payer: Self-pay | Admitting: Urology

## 2022-12-02 ENCOUNTER — Other Ambulatory Visit (HOSPITAL_COMMUNITY): Payer: Self-pay | Admitting: Urology

## 2022-12-02 DIAGNOSIS — N200 Calculus of kidney: Secondary | ICD-10-CM

## 2022-12-06 DIAGNOSIS — N200 Calculus of kidney: Secondary | ICD-10-CM | POA: Diagnosis not present

## 2022-12-17 ENCOUNTER — Encounter: Payer: Self-pay | Admitting: Family Medicine

## 2022-12-17 DIAGNOSIS — E785 Hyperlipidemia, unspecified: Secondary | ICD-10-CM

## 2022-12-17 DIAGNOSIS — R899 Unspecified abnormal finding in specimens from other organs, systems and tissues: Secondary | ICD-10-CM

## 2022-12-17 DIAGNOSIS — Z8632 Personal history of gestational diabetes: Secondary | ICD-10-CM

## 2022-12-17 NOTE — Telephone Encounter (Signed)
Coral Spikes, DO     Will you have the front schedule her an appt. Also, can you order a fasting lipid panel and have her do it before she comes. Thank you

## 2022-12-20 NOTE — Telephone Encounter (Signed)
Nurses Patient does have a follow-up visit with Dr. Lacinda Axon early next week Please order lipid profile along with A1c  Diagnosis hyperlipidemia, elevated fructosamine level  As for her concern regarding breathing if she could provide more details of what she is noticing.  Is it more so difficulty breathing all the time?  Does not seem to be related to allergies?  Is this more of difficulty breathing when she gets worried about her upcoming procedure?  More information from Lauren [name] would be helpful thank you  Dr. Lacinda Axon is not in this week.  If she feels she needs to be worked in this week to address the breathing to please let me know and we will do the best we can that addressing this with an office visit this week thank you-Dr. Sallee Lange

## 2022-12-28 ENCOUNTER — Encounter: Payer: Self-pay | Admitting: Family Medicine

## 2022-12-28 ENCOUNTER — Ambulatory Visit: Payer: Medicaid Other | Admitting: Family Medicine

## 2022-12-28 VITALS — BP 120/80 | HR 85 | Temp 98.1°F | Ht 64.5 in | Wt 173.0 lb

## 2022-12-28 DIAGNOSIS — E781 Pure hyperglyceridemia: Secondary | ICD-10-CM

## 2022-12-28 DIAGNOSIS — F411 Generalized anxiety disorder: Secondary | ICD-10-CM

## 2022-12-28 DIAGNOSIS — R809 Proteinuria, unspecified: Secondary | ICD-10-CM | POA: Diagnosis not present

## 2022-12-28 NOTE — Assessment & Plan Note (Signed)
I believe the patient's feelings that she cannot get a good deep breath is secondary to anxiety.  Reassurance provided.  Given upcoming surgery, I am not adding any pharmacotherapy at this time.  Patient will follow-up with me after surgery.

## 2022-12-28 NOTE — Progress Notes (Signed)
Subjective:  Patient ID: Lauren [name], female    DOB: 10-01-1980  Age: 42 y.o. MRN: [id number]  CC: Chief Complaint  Patient presents with   Nephrolithiasis    Surgery to remove April 23, follow from lab results for life insurance elevated triglyc.    Anxiety    Having anxiety and trouble taking a deep breath    HPI:  42 year old female presents for evaluation the above.  Patient has upcoming surgery for stone removal.  Patient reports that she recently had labs done for life insurance and there were some abnormalities.  She would like to discuss these today.  Triglycerides elevated at 322.  Patient also had evidence of proteinuria with elevated UPC and elevated urine microalbumin.  Labs are otherwise unremarkable.  Will discuss today.  Patient also reports that she feels like she is having difficulty taking a deep breath.  She is concerned that anxiety may be contributing.  Seems to be worsening recently.  However, it has been going on for quite some time.  No difficulties with activities.  Seems to worsen with stressors although it can occur without significant stress.  Patient Active Problem List   Diagnosis Date Noted   Hypertriglyceridemia 12/28/2022   Proteinuria 12/28/2022   Depression, major, recurrent, moderate 12/10/2021   Obesity (BMI 30-39.9) 12/10/2021   Generalized anxiety disorder 07/22/2020   Cough variant asthma 04/15/2017    Social Hx   Social History   Socioeconomic History   Marital status: Divorced    Spouse name: Not on file   Number of children: 3   Years of education: 14   Highest education level: Some college, no degree  Occupational History   Not on file  Tobacco Use   Smoking status: Never   Smokeless tobacco: Never  Vaping Use   Vaping Use: Never used  Substance and Sexual Activity   Alcohol use: Yes    Alcohol/week: 2.0 standard drinks of alcohol    Types: 2 Cans of beer per week   Drug use: No   Sexual activity: Yes    Birth  control/protection: Pill  Other Topics Concern   Not on file  Social History Narrative   Not on file   Social Determinants of Health   Financial Resource Strain: Patient Declined (12/26/2022)   Overall Financial Resource Strain (CARDIA)    Difficulty of Paying Living Expenses: Patient declined  Food Insecurity: Patient Declined (12/26/2022)   Hunger Vital Sign    Worried About Running Out of Food in the Last Year: Patient declined    Ran Out of Food in the Last Year: Patient declined  Transportation Needs: No Transportation Needs (12/26/2022)   PRAPARE - Administrator, Civil Service (Medical): No    Lack of Transportation (Non-Medical): No  Physical Activity: Insufficiently Active (12/26/2022)   Exercise Vital Sign    Days of Exercise per Week: 1 day    Minutes of Exercise per Session: 20 min  Stress: Stress Concern Present (12/26/2022)   [employer] of Occupational Health - Occupational Stress Questionnaire    Feeling of Stress : Rather much  Social Connections: Moderately Integrated (12/26/2022)   Social Connection and Isolation Panel [NHANES]    Frequency of Communication with Friends and Family: Twice a week    Frequency of Social Gatherings with Friends and Family: Once a week    Attends Religious Services: More than 4 times per year    Active Member of [employer] or Organizations: Yes  Attends Banker Meetings: More than 4 times per year    Marital Status: Divorced    Review of Systems Per HPI  Objective:  BP 120/80   Pulse 85   Temp 98.1 F (36.7 C)   Ht 5' 4.5" (1.638 m)   Wt 173 lb (78.5 kg)   LMP 12/19/2022 (Approximate)   SpO2 97%   BMI 29.24 kg/m      12/28/2022   10:54 AM 06/30/2022   10:16 AM 12/10/2021   11:37 AM  BP/Weight  Systolic BP 120 148 149  Diastolic BP 80 97 93  Wt. (Lbs) 173 180.2   BMI 29.24 kg/m2 30.45 kg/m2     Physical Exam Vitals and nursing note reviewed.  Constitutional:      General: She is not in acute  distress.    Appearance: Normal appearance.  HENT:     Head: Normocephalic and atraumatic.  Cardiovascular:     Rate and Rhythm: Normal rate and regular rhythm.  Pulmonary:     Effort: Pulmonary effort is normal.     Breath sounds: Normal breath sounds. No wheezing or rales.  Neurological:     Mental Status: She is alert.  Psychiatric:     Comments: Anxious.     Lab Results  Component Value Date   WBC 8.2 12/14/2021   HGB 14.2 12/14/2021   HCT 41.8 12/14/2021   PLT 317 12/14/2021   GLUCOSE 100 (H) 12/14/2021   CHOL 236 (H) 12/14/2021   TRIG 201 (H) 12/14/2021   HDL 55 12/14/2021   LDLCALC 145 (H) 12/14/2021   ALT 26 12/14/2021   AST 28 12/14/2021   NA 139 12/14/2021   K 4.4 12/14/2021   CL 101 12/14/2021   CREATININE 0.97 12/14/2021   BUN 14 12/14/2021   CO2 24 12/14/2021   TSH 1.830 12/14/2021   HGBA1C 5.8 (H) 12/14/2021     Assessment & Plan:   Problem List Items Addressed This Visit       Other   Proteinuria    Could be an isolated/spurious finding.  Will repeat in a few months after patient's surgery has been done.      Hypertriglyceridemia    Labs were not fasting.  Will plan to repeat fasting lipid panel in the near future.      Generalized anxiety disorder - Primary    I believe the patient's feelings that she cannot get a good deep breath is secondary to anxiety.  Reassurance provided.  Given upcoming surgery, I am not adding any pharmacotherapy at this time.  Patient will follow-up with me after surgery.       Everlene Other DO [facility]

## 2022-12-28 NOTE — Patient Instructions (Signed)
Will repeat labs at follow up.  We will discuss anxiety medication then as well.  Good luck with surgery.  Take care  Dr. Adriana Simas

## 2022-12-28 NOTE — Assessment & Plan Note (Signed)
Labs were not fasting.  Will plan to repeat fasting lipid panel in the near future.

## 2022-12-28 NOTE — Assessment & Plan Note (Signed)
Could be an isolated/spurious finding.  Will repeat in a few months after patient's surgery has been done.

## 2022-12-29 NOTE — Progress Notes (Signed)
COVID Vaccine received:  [x]  No []  Yes Date of any COVID positive Test in last 90 days:  None  PCP - Everlene Other, DO Cardiologist - none  Chest x-ray -  EKG -  no pertinent history Stress Test -  ECHO -  Cardiac Cath -   PCR screen: []  Ordered & Completed           []   No Order but Needs PROFEND           [x]   N/A for this surgery  Surgery Plan:  []  Ambulatory                            [x]  Outpatient in bed                            []  Admit  Anesthesia:    [x]  General  []  Spinal                           []   Choice []   MAC  Bowel Prep - [x]  No  []   Yes ______  Pacemaker / ICD device [x]  No []  Yes   Spinal Cord Stimulator:[x]  No []  Yes       History of Sleep Apnea? [x]  No []  Yes   CPAP used?- [x]  No []  Yes    Does the patient monitor blood sugar?          []  No []  Yes  [x]  N/A Gestational DM only  Patient has: []  NO Hx DM   []  Pre-DM                 []  DM1  []   DM2  Blood Thinner / Instructions: none Aspirin Instructions: none  ERAS Protocol Ordered: [x]  No  []  Yes Patient is to be NPO after: Midnight prior  Comments: Patient signed a blood consent as per Dr. Shannan Harper order.   Activity level: Patient is able to climb a flight of stairs without difficulty; [x]  No CP  but would have _SOB. Patient can perform ADLs without assistance.   Anesthesia review: asthma, anxiety, anemia, gestational DM only.  Patient was very anxious today, states that she has a "very small opening in her throat". She is also nervous d/t her boyfriend having emergency surgery and he had issues with breathing following extubation. I asked Shanda Bumps to look at the patient and discuss her airway. Patient was very appreciative of Jessica's time explaining her intubation process.   Patient denies shortness of breath, fever, cough and chest pain at PAT appointment.  Patient verbalized understanding and agreement to the Pre-Surgical Instructions that were given to them at this PAT appointment. Patient  was also educated of the need to review these PAT instructions again prior to her surgery.I reviewed the appropriate phone numbers to call if they have any and questions or concerns.

## 2022-12-29 NOTE — Patient Instructions (Signed)
SURGICAL WAITING ROOM VISITATION Patients having surgery or a procedure may have no more than 2 support people in the waiting area - these visitors may rotate in the visitor waiting room.   Due to an increase in RSV and influenza rates and associated hospitalizations, children ages 47 and under may not visit patients in [facility] hospitals. If the patient needs to stay at the hospital during part of their recovery, the visitor guidelines for inpatient rooms apply.  PRE-OP VISITATION  Pre-op nurse will coordinate an appropriate time for 1 support person to accompany the patient in pre-op.  This support person may not rotate.  This visitor will be contacted when the time is appropriate for the visitor to come back in the pre-op area.  Please refer to the [facility] website for the visitor guidelines for Inpatients (after your surgery is over and you are in a regular room).  You are not required to quarantine at this time prior to your surgery. However, you must do this: Hand Hygiene often Do NOT share personal items Notify your provider if you are in close contact with someone who has COVID or you develop fever 100.4 or greater, new onset of sneezing, cough, sore throat, shortness of breath or body aches.  If you test positive for Covid or have been in contact with anyone that has tested positive in the last 10 days please notify you surgeon.    Your procedure is scheduled on:  Monday January 10, 2023  Report to [facility] Main Entrance: [place] entrance where the [employer] is available.   Report to admitting at: 09:30 AM  +++++Call this number if you have any questions or problems the morning of surgery [phone]  DO NOT EAT OR DRINK ANYTHING AFTER MIDNIGHT THE NIGHT PRIOR TO YOUR SURGERY / PROCEDURE.   FOLLOW BOWEL PREP AND ANY ADDITIONAL PRE OP INSTRUCTIONS YOU RECEIVED FROM YOUR SURGEON'S OFFICE!!!   Oral Hygiene is also important to reduce your risk of infection.         Remember - BRUSH YOUR TEETH THE MORNING OF SURGERY WITH YOUR REGULAR TOOTHPASTE   Take ONLY these medicines the morning of surgery with A SIP OF WATER: omeprazole                    You may not have any metal on your body including hair pins, jewelry, and body piercing  Do not wear make-up, lotions, powders, perfumes or deodorant  Do not wear nail polish including gel and S&S, artificial / acrylic nails, or any other type of covering on natural nails including finger and toenails. If you have artificial nails, gel coating, etc., that needs to be removed by a nail salon, Please have this removed prior to surgery. Not doing so may mean that your surgery could be cancelled or delayed if the Surgeon or anesthesia staff feels like they are unable to monitor you safely.   Do not shave 48 hours prior to surgery to avoid nicks in your skin which may contribute to postoperative infections.   You may bring a small overnight bag with you on the day of surgery, only pack items that are not valuable. [facility] IS NOT RESPONSIBLE   FOR VALUABLES THAT ARE LOST OR STOLEN.   Do not bring your home medications to the hospital. The Pharmacy will dispense medications listed on your medication list to you during your admission in the Hospital.   Please read over the following fact  sheets you were given: IF YOU HAVE QUESTIONS ABOUT YOUR PRE-OP INSTRUCTIONS, PLEASE CALL [phone].   [place] - Preparing for Surgery Before surgery, you can play an important role.  Because skin is not sterile, your skin needs to be as free of germs as possible.  You can reduce the number of germs on your skin by washing with CHG (chlorahexidine gluconate) soap before surgery.  CHG is an antiseptic cleaner which kills germs and bonds with the skin to continue killing germs even after washing. Please DO NOT use if you have an allergy to CHG or antibacterial soaps.  If your skin becomes reddened/irritated stop using the CHG  and inform your nurse when you arrive at Short Stay. Do not shave (including legs and underarms) for at least 48 hours prior to the first CHG shower.  You may shave your face/neck.  Please follow these instructions carefully:  1.  Shower with CHG Soap the night before surgery and the  morning of surgery.  2.  If you choose to wash your hair, wash your hair first as usual with your normal  shampoo.  3.  After you shampoo, rinse your hair and body thoroughly to remove the shampoo.                             4.  Use CHG as you would any other liquid soap.  You can apply chg directly to the skin and wash.  Gently with a scrungie or clean washcloth.  5.  Apply the CHG Soap to your body ONLY FROM THE NECK DOWN.   Do not use on face/ open                           Wound or open sores. Avoid contact with eyes, ears mouth and genitals (private parts).                       Wash face,  Genitals (private parts) with your normal soap.             6.  Wash thoroughly, paying special attention to the area where your  surgery  will be performed.  7.  Thoroughly rinse your body with warm water from the neck down.  8.  DO NOT shower/wash with your normal soap after using and rinsing off the CHG Soap.            9.  Pat yourself dry with a clean towel.            10.  Wear clean pajamas.            11.  Place clean sheets on your bed the night of your first shower and do not  sleep with pets.  ON THE DAY OF SURGERY : Do not apply any lotions/deodorants the morning of surgery.  Please wear clean clothes to the hospital/surgery center.    FAILURE TO FOLLOW THESE INSTRUCTIONS MAY RESULT IN THE CANCELLATION OF YOUR SURGERY  PATIENT SIGNATURE_________________________________  NURSE SIGNATURE__________________________________  ________________________________________________________________________

## 2022-12-30 ENCOUNTER — Encounter (HOSPITAL_COMMUNITY)
Admission: RE | Admit: 2022-12-30 | Discharge: 2022-12-30 | Disposition: A | Discharge status: Home / Self Care | Payer: Medicaid Other | Source: Ambulatory Visit | Attending: Urology | Admitting: Urology

## 2022-12-30 ENCOUNTER — Other Ambulatory Visit: Payer: Self-pay

## 2022-12-30 ENCOUNTER — Encounter (HOSPITAL_COMMUNITY): Payer: Self-pay

## 2022-12-30 VITALS — BP 139/84 | HR 84 | Temp 98.3°F | Resp 16 | Ht 65.0 in | Wt 172.0 lb

## 2022-12-30 DIAGNOSIS — Z01818 Encounter for other preprocedural examination: Secondary | ICD-10-CM

## 2022-12-30 HISTORY — DX: Unspecified asthma, uncomplicated: J45.909

## 2022-12-30 LAB — BASIC METABOLIC PANEL
Anion gap: 8 (ref 5–15)
BUN: 16 mg/dL (ref 6–20)
CO2: 27 mmol/L (ref 22–32)
Calcium: 9.1 mg/dL (ref 8.9–10.3)
Chloride: 104 mmol/L (ref 98–111)
Creatinine, Ser: 1.11 mg/dL — ABNORMAL HIGH (ref 0.44–1.00)
GFR, Estimated: >60 mL/min (ref >60)
Glucose, Bld: 157 mg/dL — ABNORMAL HIGH (ref 70–99)
Potassium: 3.8 mmol/L (ref 3.5–5.1)
Sodium: 139 mmol/L (ref 135–145)

## 2022-12-30 LAB — TYPE AND SCREEN
ABO/RH(D): A POS
Antibody Screen: NEGATIVE

## 2022-12-30 LAB — CBC
HCT: 38.9 % (ref 36.0–46.0)
Hemoglobin: 12.5 g/dL (ref 12.0–15.0)
MCH: 28.3 pg (ref 26.0–34.0)
MCHC: 32.1 g/dL (ref 30.0–36.0)
MCV: 88 fL (ref 80.0–100.0)
Platelets: 275 10*3/uL (ref 150–400)
RBC: 4.42 MIL/uL (ref 3.87–5.11)
RDW: 11.9 % (ref 11.5–15.5)
WBC: 7.3 10*3/uL (ref 4.0–10.5)
nRBC: 0 % (ref 0.0–0.2)

## 2023-01-07 ENCOUNTER — Other Ambulatory Visit: Payer: Self-pay | Admitting: Radiology

## 2023-01-07 DIAGNOSIS — N200 Calculus of kidney: Secondary | ICD-10-CM

## 2023-01-10 ENCOUNTER — Ambulatory Visit (HOSPITAL_COMMUNITY): Payer: Medicaid Other

## 2023-01-10 ENCOUNTER — Ambulatory Visit (HOSPITAL_COMMUNITY)
Admission: RE | Admit: 2023-01-10 | Discharge: 2023-01-10 | Disposition: A | Discharge status: Home / Self Care | Payer: Medicaid Other | Source: Ambulatory Visit | Attending: Urology | Admitting: Urology

## 2023-01-10 ENCOUNTER — Ambulatory Visit (HOSPITAL_COMMUNITY): Payer: Medicaid Other | Admitting: Physician Assistant

## 2023-01-10 ENCOUNTER — Ambulatory Visit (HOSPITAL_BASED_OUTPATIENT_CLINIC_OR_DEPARTMENT_OTHER): Payer: Medicaid Other | Admitting: Anesthesiology

## 2023-01-10 ENCOUNTER — Inpatient Hospital Stay (HOSPITAL_COMMUNITY)
Admission: RE | Admit: 2023-01-10 | Discharge: 2023-01-13 | DRG: 660 | Disposition: A | Discharge status: Home / Self Care | Payer: Medicaid Other | Attending: Urology | Admitting: Urology

## 2023-01-10 ENCOUNTER — Encounter (HOSPITAL_COMMUNITY): Payer: Self-pay | Admitting: Urology

## 2023-01-10 ENCOUNTER — Encounter (HOSPITAL_COMMUNITY)
Admission: RE | Disposition: A | Discharge status: Home / Self Care | Payer: Self-pay | Source: Home / Self Care | Attending: Urology

## 2023-01-10 ENCOUNTER — Other Ambulatory Visit: Payer: Self-pay

## 2023-01-10 DIAGNOSIS — Z8249 Family history of ischemic heart disease and other diseases of the circulatory system: Secondary | ICD-10-CM

## 2023-01-10 DIAGNOSIS — Z79899 Other long term (current) drug therapy: Secondary | ICD-10-CM

## 2023-01-10 DIAGNOSIS — N200 Calculus of kidney: Secondary | ICD-10-CM

## 2023-01-10 DIAGNOSIS — R10A0 Flank pain, unspecified side: Secondary | ICD-10-CM | POA: Diagnosis present

## 2023-01-10 DIAGNOSIS — R102 Pelvic and perineal pain: Secondary | ICD-10-CM | POA: Diagnosis present

## 2023-01-10 DIAGNOSIS — K760 Fatty (change of) liver, not elsewhere classified: Secondary | ICD-10-CM | POA: Diagnosis present

## 2023-01-10 DIAGNOSIS — F418 Other specified anxiety disorders: Secondary | ICD-10-CM | POA: Diagnosis not present

## 2023-01-10 DIAGNOSIS — E119 Type 2 diabetes mellitus without complications: Secondary | ICD-10-CM | POA: Diagnosis present

## 2023-01-10 DIAGNOSIS — Z793 Long term (current) use of hormonal contraceptives: Secondary | ICD-10-CM

## 2023-01-10 DIAGNOSIS — R110 Nausea: Secondary | ICD-10-CM

## 2023-01-10 DIAGNOSIS — J45909 Unspecified asthma, uncomplicated: Secondary | ICD-10-CM | POA: Diagnosis present

## 2023-01-10 DIAGNOSIS — R109 Unspecified abdominal pain: Secondary | ICD-10-CM | POA: Diagnosis present

## 2023-01-10 DIAGNOSIS — Z01818 Encounter for other preprocedural examination: Secondary | ICD-10-CM

## 2023-01-10 DIAGNOSIS — D72829 Elevated white blood cell count, unspecified: Secondary | ICD-10-CM | POA: Diagnosis not present

## 2023-01-10 DIAGNOSIS — Z833 Family history of diabetes mellitus: Secondary | ICD-10-CM

## 2023-01-10 DIAGNOSIS — N202 Calculus of kidney with calculus of ureter: Secondary | ICD-10-CM | POA: Diagnosis not present

## 2023-01-10 DIAGNOSIS — F411 Generalized anxiety disorder: Secondary | ICD-10-CM | POA: Diagnosis present

## 2023-01-10 DIAGNOSIS — Z87442 Personal history of urinary calculi: Secondary | ICD-10-CM

## 2023-01-10 DIAGNOSIS — I10 Essential (primary) hypertension: Secondary | ICD-10-CM

## 2023-01-10 DIAGNOSIS — N179 Acute kidney failure, unspecified: Secondary | ICD-10-CM | POA: Diagnosis present

## 2023-01-10 HISTORY — PX: NEPHROLITHOTOMY: SHX5134

## 2023-01-10 HISTORY — PX: IR URETERAL STENT LEFT NEW ACCESS W/O SEP NEPHROSTOMY CATH: IMG6075

## 2023-01-10 HISTORY — DX: Family history of other specified conditions: Z84.89

## 2023-01-10 LAB — POCT PREGNANCY, URINE: Preg Test, Ur: NEGATIVE

## 2023-01-10 LAB — BASIC METABOLIC PANEL
Anion gap: 10 (ref 5–15)
Anion gap: 15 (ref 5–15)
BUN: 13 mg/dL (ref 6–20)
BUN: 12 mg/dL (ref 6–20)
CO2: 22 mmol/L (ref 22–32)
CO2: 21 mmol/L — ABNORMAL LOW (ref 22–32)
Calcium: 8.9 mg/dL (ref 8.9–10.3)
Calcium: 8.2 mg/dL — ABNORMAL LOW (ref 8.9–10.3)
Chloride: 106 mmol/L (ref 98–111)
Chloride: 102 mmol/L (ref 98–111)
Creatinine, Ser: 1.08 mg/dL — ABNORMAL HIGH (ref 0.44–1.00)
Creatinine, Ser: 1.28 mg/dL — ABNORMAL HIGH (ref 0.44–1.00)
GFR, Estimated: >60 mL/min (ref >60)
GFR, Estimated: 54 mL/min — ABNORMAL LOW (ref >60)
Glucose, Bld: 91 mg/dL (ref 70–99)
Glucose, Bld: 142 mg/dL — ABNORMAL HIGH (ref 70–99)
Potassium: 3.9 mmol/L (ref 3.5–5.1)
Potassium: 3.7 mmol/L (ref 3.5–5.1)
Sodium: 138 mmol/L (ref 135–145)
Sodium: 138 mmol/L (ref 135–145)

## 2023-01-10 LAB — HIV ANTIBODY (ROUTINE TESTING W REFLEX): HIV Screen 4th Generation wRfx: NONREACTIVE

## 2023-01-10 LAB — CBC WITH DIFFERENTIAL/PLATELET
Abs Immature Granulocytes: 0.03 10*3/uL (ref 0.00–0.07)
Basophils Absolute: 0.1 10*3/uL (ref 0.0–0.1)
Basophils Relative: 1 %
Eosinophils Absolute: 0.2 10*3/uL (ref 0.0–0.5)
Eosinophils Relative: 2 %
HCT: 39.6 % (ref 36.0–46.0)
Hemoglobin: 12.8 g/dL (ref 12.0–15.0)
Immature Granulocytes: 0 %
Lymphocytes Relative: 22 %
Lymphs Abs: 1.8 10*3/uL (ref 0.7–4.0)
MCH: 28.2 pg (ref 26.0–34.0)
MCHC: 32.3 g/dL (ref 30.0–36.0)
MCV: 87.2 fL (ref 80.0–100.0)
Monocytes Absolute: 0.4 10*3/uL (ref 0.1–1.0)
Monocytes Relative: 5 %
Neutro Abs: 5.9 10*3/uL (ref 1.7–7.7)
Neutrophils Relative %: 70 %
Platelets: 314 10*3/uL (ref 150–400)
RBC: 4.54 MIL/uL (ref 3.87–5.11)
RDW: 12.4 % (ref 11.5–15.5)
WBC: 8.4 10*3/uL (ref 4.0–10.5)
nRBC: 0 % (ref 0.0–0.2)

## 2023-01-10 LAB — HEMOGLOBIN AND HEMATOCRIT, BLOOD
HCT: 40.8 % (ref 36.0–46.0)
Hemoglobin: 13.2 g/dL (ref 12.0–15.0)

## 2023-01-10 LAB — ABO/RH: ABO/RH(D): A POS

## 2023-01-10 LAB — PROTIME-INR
INR: 0.9 (ref 0.8–1.2)
Prothrombin Time: 12.4 seconds (ref 11.4–15.2)

## 2023-01-10 SURGERY — NEPHROLITHOTOMY PERCUTANEOUS
Anesthesia: General | Laterality: Left

## 2023-01-10 MED ORDER — DOCUSATE SODIUM 100 MG PO CAPS
100.0000 mg | ORAL_CAPSULE | Freq: Two times a day (BID) | ORAL | Status: DC
  Administered 2023-01-10 – 2023-01-13 (×6): 100 mg via ORAL
  Filled 2023-01-10 (×6): qty 1

## 2023-01-10 MED ORDER — AMISULPRIDE (ANTIEMETIC) 5 MG/2ML IV SOLN
10.0000 mg | Freq: Once | INTRAVENOUS | Status: AC | PRN
  Administered 2023-01-10: 10 mg via INTRAVENOUS

## 2023-01-10 MED ORDER — METOPROLOL TARTRATE 5 MG/5ML IV SOLN
INTRAVENOUS | Status: AC
  Filled 2023-01-10: qty 5

## 2023-01-10 MED ORDER — FENTANYL CITRATE (PF) 100 MCG/2ML IJ SOLN
INTRAMUSCULAR | Status: AC
  Filled 2023-01-10: qty 2

## 2023-01-10 MED ORDER — FENTANYL CITRATE (PF) 100 MCG/2ML IJ SOLN
INTRAMUSCULAR | Status: DC | PRN
  Administered 2023-01-10 (×2): 50 ug via INTRAVENOUS

## 2023-01-10 MED ORDER — OXYCODONE HCL 5 MG PO TABS
5.0000 mg | ORAL_TABLET | ORAL | Status: DC | PRN
  Administered 2023-01-10 – 2023-01-12 (×4): 5 mg via ORAL
  Filled 2023-01-10 (×4): qty 1

## 2023-01-10 MED ORDER — DEXAMETHASONE SODIUM PHOSPHATE 10 MG/ML IJ SOLN
INTRAMUSCULAR | Status: DC | PRN
  Administered 2023-01-10: 10 mg via INTRAVENOUS

## 2023-01-10 MED ORDER — SODIUM CHLORIDE 0.9 % IR SOLN
Status: DC | PRN
  Administered 2023-01-10: 15000 mL via INTRAVESICAL

## 2023-01-10 MED ORDER — FENTANYL CITRATE (PF) 100 MCG/2ML IJ SOLN
INTRAMUSCULAR | Status: AC
  Filled 2023-01-10: qty 4

## 2023-01-10 MED ORDER — OXYBUTYNIN CHLORIDE 5 MG PO TABS
ORAL_TABLET | ORAL | Status: AC
  Filled 2023-01-10: qty 1

## 2023-01-10 MED ORDER — OXYCODONE HCL 5 MG/5ML PO SOLN: 5.0000 mg | Freq: Once | ORAL | Status: DC | PRN

## 2023-01-10 MED ORDER — SODIUM CHLORIDE 0.9 % IV SOLN
2.0000 g | Freq: Once | INTRAVENOUS | Status: DC
Start: 2023-01-10 — End: 2023-01-10
  Administered 2023-01-10: 2 g via INTRAVENOUS

## 2023-01-10 MED ORDER — ACETAMINOPHEN 500 MG PO TABS
1000.0000 mg | ORAL_TABLET | Freq: Once | ORAL | Status: AC
  Administered 2023-01-10: 1000 mg via ORAL
  Filled 2023-01-10: qty 2

## 2023-01-10 MED ORDER — MIDAZOLAM HCL 2 MG/2ML IJ SOLN
INTRAMUSCULAR | Status: AC | PRN
  Administered 2023-01-10: 1 mg via INTRAVENOUS
  Administered 2023-01-10 (×2): 2 mg via INTRAVENOUS
  Administered 2023-01-10 (×3): 1 mg via INTRAVENOUS

## 2023-01-10 MED ORDER — LACTATED RINGERS IV SOLN: INTRAVENOUS | Status: DC

## 2023-01-10 MED ORDER — MIDAZOLAM HCL 2 MG/2ML IJ SOLN
INTRAMUSCULAR | Status: AC
  Filled 2023-01-10: qty 6

## 2023-01-10 MED ORDER — FENTANYL CITRATE PF 50 MCG/ML IJ SOSY
PREFILLED_SYRINGE | INTRAMUSCULAR | Status: AC
  Filled 2023-01-10: qty 3

## 2023-01-10 MED ORDER — DEXMEDETOMIDINE HCL IN NACL 80 MCG/20ML IV SOLN
INTRAVENOUS | Status: DC | PRN
  Administered 2023-01-10 (×2): 8 ug via BUCCAL

## 2023-01-10 MED ORDER — TAMSULOSIN HCL 0.4 MG PO CAPS: 0.4000 mg | ORAL_CAPSULE | Freq: Every day | ORAL | 1 refills | Status: DC

## 2023-01-10 MED ORDER — DEXAMETHASONE SODIUM PHOSPHATE 10 MG/ML IJ SOLN
INTRAMUSCULAR | Status: AC
  Filled 2023-01-10: qty 1

## 2023-01-10 MED ORDER — SUGAMMADEX SODIUM 200 MG/2ML IV SOLN
INTRAVENOUS | Status: DC | PRN
  Administered 2023-01-10: 200 mg via INTRAVENOUS
  Administered 2023-01-10: 50 mg via INTRAVENOUS

## 2023-01-10 MED ORDER — SODIUM CHLORIDE 0.9 % IV SOLN
INTRAVENOUS | Status: AC
  Filled 2023-01-10: qty 20

## 2023-01-10 MED ORDER — ROCURONIUM BROMIDE 10 MG/ML (PF) SYRINGE
PREFILLED_SYRINGE | INTRAVENOUS | Status: DC | PRN
  Administered 2023-01-10: 70 mg via INTRAVENOUS

## 2023-01-10 MED ORDER — METOPROLOL TARTRATE 5 MG/5ML IV SOLN
INTRAVENOUS | Status: DC | PRN
  Administered 2023-01-10: 3 mg via INTRAVENOUS

## 2023-01-10 MED ORDER — ONDANSETRON HCL 4 MG/2ML IJ SOLN
INTRAMUSCULAR | Status: DC | PRN
  Administered 2023-01-10: 4 mg via INTRAVENOUS

## 2023-01-10 MED ORDER — PHENYLEPHRINE 80 MCG/ML (10ML) SYRINGE FOR IV PUSH (FOR BLOOD PRESSURE SUPPORT)
PREFILLED_SYRINGE | INTRAVENOUS | Status: DC | PRN
  Administered 2023-01-10 (×4): 80 ug via INTRAVENOUS

## 2023-01-10 MED ORDER — HYDROMORPHONE HCL 1 MG/ML IJ SOLN
0.5000 mg | INTRAMUSCULAR | Status: DC | PRN
  Administered 2023-01-10 (×2): 1 mg via INTRAVENOUS
  Administered 2023-01-10: 0.5 mg via INTRAVENOUS
  Administered 2023-01-11 – 2023-01-12 (×6): 1 mg via INTRAVENOUS
  Filled 2023-01-10 (×9): qty 1

## 2023-01-10 MED ORDER — LIDOCAINE HCL 1 % IJ SOLN
INTRAMUSCULAR | Status: AC
  Filled 2023-01-10: qty 20

## 2023-01-10 MED ORDER — CHLORHEXIDINE GLUCONATE 0.12 % MT SOLN
15.0000 mL | Freq: Once | OROMUCOSAL | Status: AC
  Administered 2023-01-10: 15 mL via OROMUCOSAL

## 2023-01-10 MED ORDER — 0.9 % SODIUM CHLORIDE (POUR BTL) OPTIME
TOPICAL | Status: DC | PRN
  Administered 2023-01-10: 1000 mL

## 2023-01-10 MED ORDER — DIPHENHYDRAMINE HCL 50 MG/ML IJ SOLN: 12.5000 mg | Freq: Four times a day (QID) | INTRAMUSCULAR | Status: DC | PRN

## 2023-01-10 MED ORDER — DIPHENHYDRAMINE HCL 50 MG/ML IJ SOLN
INTRAMUSCULAR | Status: AC
  Filled 2023-01-10: qty 1

## 2023-01-10 MED ORDER — PANTOPRAZOLE SODIUM 40 MG PO TBEC
40.0000 mg | DELAYED_RELEASE_TABLET | Freq: Every day | ORAL | Status: DC
  Administered 2023-01-10 – 2023-01-13 (×4): 40 mg via ORAL
  Filled 2023-01-10 (×4): qty 1

## 2023-01-10 MED ORDER — LIDOCAINE 2% (20 MG/ML) 5 ML SYRINGE
INTRAMUSCULAR | Status: DC | PRN
  Administered 2023-01-10: 60 mg via INTRAVENOUS

## 2023-01-10 MED ORDER — DIPHENHYDRAMINE HCL 12.5 MG/5ML PO ELIX: 12.5000 mg | ORAL_SOLUTION | Freq: Four times a day (QID) | ORAL | Status: DC | PRN

## 2023-01-10 MED ORDER — TAMSULOSIN HCL 0.4 MG PO CAPS
0.4000 mg | ORAL_CAPSULE | Freq: Every day | ORAL | Status: DC
  Administered 2023-01-10 – 2023-01-13 (×4): 0.4 mg via ORAL
  Filled 2023-01-10 (×4): qty 1

## 2023-01-10 MED ORDER — ZOLPIDEM TARTRATE 5 MG PO TABS: 5.0000 mg | ORAL_TABLET | Freq: Every evening | ORAL | Status: DC | PRN

## 2023-01-10 MED ORDER — IOHEXOL 300 MG/ML  SOLN
50.0000 mL | Freq: Once | INTRAMUSCULAR | Status: AC | PRN
  Administered 2023-01-10: 50 mL via INTRAVENOUS

## 2023-01-10 MED ORDER — SODIUM CHLORIDE 0.9 % IV SOLN
INTRAVENOUS | Status: DC
  Administered 2023-01-10: 1000 mL via INTRAVENOUS

## 2023-01-10 MED ORDER — IOHEXOL 300 MG/ML  SOLN
50.0000 mL | Freq: Once | INTRAMUSCULAR | Status: AC | PRN
  Administered 2023-01-10: 10 mL

## 2023-01-10 MED ORDER — OXYCODONE HCL 5 MG PO TABS: 5.0000 mg | ORAL_TABLET | Freq: Once | ORAL | Status: DC | PRN

## 2023-01-10 MED ORDER — SENNA 8.6 MG PO TABS
1.0000 | ORAL_TABLET | Freq: Two times a day (BID) | ORAL | Status: DC
  Administered 2023-01-10 – 2023-01-13 (×6): 8.6 mg via ORAL
  Filled 2023-01-10 (×6): qty 1

## 2023-01-10 MED ORDER — ACETAMINOPHEN 325 MG PO TABS
650.0000 mg | ORAL_TABLET | ORAL | Status: DC | PRN
  Administered 2023-01-11 – 2023-01-12 (×2): 650 mg via ORAL
  Filled 2023-01-10 (×2): qty 2

## 2023-01-10 MED ORDER — LIDOCAINE HCL 1 % IJ SOLN
20.0000 mL | Freq: Once | INTRAMUSCULAR | Status: AC
  Administered 2023-01-10: 18 mL via INTRADERMAL

## 2023-01-10 MED ORDER — HYDROCODONE-ACETAMINOPHEN 5-325 MG PO TABS: 1.0000 | ORAL_TABLET | ORAL | 0 refills | Status: DC | PRN

## 2023-01-10 MED ORDER — SODIUM CHLORIDE 0.9 % IV SOLN: INTRAVENOUS | Status: DC

## 2023-01-10 MED ORDER — PROPOFOL 10 MG/ML IV BOLUS
INTRAVENOUS | Status: AC
  Filled 2023-01-10: qty 20

## 2023-01-10 MED ORDER — LABETALOL HCL 5 MG/ML IV SOLN
INTRAVENOUS | Status: DC | PRN
  Administered 2023-01-10 (×2): 2.5 mg via INTRAVENOUS

## 2023-01-10 MED ORDER — ORAL CARE MOUTH RINSE: 15.0000 mL | Freq: Once | OROMUCOSAL | Status: AC

## 2023-01-10 MED ORDER — PROPOFOL 10 MG/ML IV BOLUS
INTRAVENOUS | Status: DC | PRN
  Administered 2023-01-10: 200 mg via INTRAVENOUS

## 2023-01-10 MED ORDER — MIDAZOLAM HCL 2 MG/2ML IJ SOLN
INTRAMUSCULAR | Status: AC
  Filled 2023-01-10: qty 2

## 2023-01-10 MED ORDER — AMISULPRIDE (ANTIEMETIC) 5 MG/2ML IV SOLN
INTRAVENOUS | Status: AC
  Filled 2023-01-10: qty 4

## 2023-01-10 MED ORDER — MIDAZOLAM HCL 5 MG/5ML IJ SOLN
INTRAMUSCULAR | Status: DC | PRN
  Administered 2023-01-10 (×2): 1 mg via INTRAVENOUS

## 2023-01-10 MED ORDER — DIPHENHYDRAMINE HCL 50 MG/ML IJ SOLN
INTRAMUSCULAR | Status: AC | PRN
  Administered 2023-01-10: 25 mg via INTRAVENOUS

## 2023-01-10 MED ORDER — CEFAZOLIN SODIUM-DEXTROSE 2-4 GM/100ML-% IV SOLN
2.0000 g | INTRAVENOUS | Status: DC
  Filled 2023-01-10: qty 100

## 2023-01-10 MED ORDER — LABETALOL HCL 5 MG/ML IV SOLN
INTRAVENOUS | Status: AC
  Filled 2023-01-10: qty 4

## 2023-01-10 MED ORDER — HYDROMORPHONE HCL 1 MG/ML IJ SOLN
INTRAMUSCULAR | Status: AC
  Filled 2023-01-10: qty 1

## 2023-01-10 MED ORDER — ONDANSETRON HCL 4 MG/2ML IJ SOLN
INTRAMUSCULAR | Status: AC
  Filled 2023-01-10: qty 2

## 2023-01-10 MED ORDER — NORETHIN ACE-ETH ESTRAD-FE 1.5-30 MG-MCG PO TABS: 1.0000 | ORAL_TABLET | Freq: Every day | ORAL | Status: DC

## 2023-01-10 MED ORDER — DEXMEDETOMIDINE HCL IN NACL 80 MCG/20ML IV SOLN
INTRAVENOUS | Status: AC
  Filled 2023-01-10: qty 20

## 2023-01-10 MED ORDER — TRIPLE ANTIBIOTIC 3.5-400-5000 EX OINT: 1.0000 | TOPICAL_OINTMENT | Freq: Three times a day (TID) | CUTANEOUS | Status: DC | PRN

## 2023-01-10 MED ORDER — IOHEXOL 300 MG/ML  SOLN
INTRAMUSCULAR | Status: DC | PRN
  Administered 2023-01-10: 100 mL

## 2023-01-10 MED ORDER — FENTANYL CITRATE (PF) 100 MCG/2ML IJ SOLN
INTRAMUSCULAR | Status: AC | PRN
  Administered 2023-01-10 (×4): 50 ug via INTRAVENOUS

## 2023-01-10 MED ORDER — ONDANSETRON HCL 4 MG/2ML IJ SOLN
4.0000 mg | INTRAMUSCULAR | Status: DC | PRN
  Administered 2023-01-10 – 2023-01-11 (×3): 4 mg via INTRAVENOUS
  Filled 2023-01-10 (×4): qty 2

## 2023-01-10 MED ORDER — OXYBUTYNIN CHLORIDE 5 MG PO TABS
5.0000 mg | ORAL_TABLET | Freq: Three times a day (TID) | ORAL | Status: DC | PRN
  Administered 2023-01-10 – 2023-01-11 (×2): 5 mg via ORAL
  Filled 2023-01-10: qty 1

## 2023-01-10 MED ORDER — FENTANYL CITRATE PF 50 MCG/ML IJ SOSY
25.0000 ug | PREFILLED_SYRINGE | INTRAMUSCULAR | Status: DC | PRN
  Administered 2023-01-10 (×2): 50 ug via INTRAVENOUS

## 2023-01-10 SURGICAL SUPPLY — 56 items
APL PRP STRL LF DISP 70% ISPRP (MISCELLANEOUS) ×1
APL SKNCLS STERI-STRIP NONHPOA (GAUZE/BANDAGES/DRESSINGS) ×1
BAG COUNTER SPONGE SURGICOUNT (BAG) IMPLANT
BAG DRN RND TRDRP ANRFLXCHMBR (UROLOGICAL SUPPLIES)
BAG SPNG CNTER NS LX DISP (BAG)
BAG URINE DRAIN 2000ML AR STRL (UROLOGICAL SUPPLIES) IMPLANT
BASKET ZERO TIP NITINOL 2.4FR (BASKET) IMPLANT
BENZOIN TINCTURE PRP APPL 2/3 (GAUZE/BANDAGES/DRESSINGS) ×1 IMPLANT
BLADE SURG 15 STRL LF DISP TIS (BLADE) ×1 IMPLANT
BLADE SURG 15 STRL SS (BLADE) ×1
BSKT STON RTRVL ZERO TP 2.4FR (BASKET) ×1
CATH FOLEY 2W COUNCIL 20FR 5CC (CATHETERS) IMPLANT
CATH FOLEY 2WAY SLVR  5CC 16FR (CATHETERS) ×1
CATH FOLEY 2WAY SLVR 5CC 16FR (CATHETERS) IMPLANT
CATH ROBINSON RED A/P 20FR (CATHETERS) IMPLANT
CATH URETERAL DUAL LUMEN 10F (MISCELLANEOUS) ×1 IMPLANT
CATH X-FORCE N30 NEPHROSTOMY (TUBING) ×1 IMPLANT
CHLORAPREP W/TINT 26 (MISCELLANEOUS) ×1 IMPLANT
COVER BACK TABLE 60X90IN (DRAPES) ×1 IMPLANT
COVER SURGICAL LIGHT HANDLE (MISCELLANEOUS) IMPLANT
DRAPE C-ARM 42X120 X-RAY (DRAPES) ×1 IMPLANT
DRAPE LINGEMAN PERC (DRAPES) ×1 IMPLANT
DRAPE SURG IRRIG POUCH 19X23 (DRAPES) ×1 IMPLANT
DRSG TEGADERM 6X8 (GAUZE/BANDAGES/DRESSINGS) IMPLANT
DRSG TEGADERM 8X12 (GAUZE/BANDAGES/DRESSINGS) IMPLANT
GAUZE PAD ABD 8X10 STRL (GAUZE/BANDAGES/DRESSINGS) ×2 IMPLANT
GAUZE SPONGE 4X4 12PLY STRL (GAUZE/BANDAGES/DRESSINGS) IMPLANT
GLOVE BIO SURGEON STRL SZ7.5 (GLOVE) ×1 IMPLANT
GOWN STRL REUS W/ TWL XL LVL3 (GOWN DISPOSABLE) ×1 IMPLANT
GOWN STRL REUS W/TWL XL LVL3 (GOWN DISPOSABLE) ×1
GUIDEWIRE AMPLAZ .035X145 (WIRE) ×2 IMPLANT
KIT BASIN OR (CUSTOM PROCEDURE TRAY) ×1 IMPLANT
KIT PROBE TRILOGY 3.9X350 (MISCELLANEOUS) IMPLANT
KIT TURNOVER KIT A (KITS) IMPLANT
LASER FIB FLEXIVA PULSE ID 365 (Laser) IMPLANT
LUBRICANT JELLY K Y 4OZ (MISCELLANEOUS) ×1 IMPLANT
MANIFOLD NEPTUNE II (INSTRUMENTS) ×1 IMPLANT
NS IRRIG 1000ML POUR BTL (IV SOLUTION) ×1 IMPLANT
PACK CYSTO (CUSTOM PROCEDURE TRAY) IMPLANT
SPONGE T-LAP 4X18 ~~LOC~~+RFID (SPONGE) ×1 IMPLANT
STENT ENDOURETEROTOMY 7-14 26C (STENTS) IMPLANT
STENT URET 6FRX26 CONTOUR (STENTS) IMPLANT
SUT CHROMIC 3 0 SH 27 (SUTURE) IMPLANT
SUT MNCRL AB 4-0 PS2 18 (SUTURE) IMPLANT
SUT SILK 2 0 30  PSL (SUTURE) ×1
SUT SILK 2 0 30 PSL (SUTURE) IMPLANT
SYR 10ML LL (SYRINGE) ×1 IMPLANT
SYR 20ML LL LF (SYRINGE) ×1 IMPLANT
TOWEL OR 17X26 10 PK STRL BLUE (TOWEL DISPOSABLE) ×1 IMPLANT
TRACTIP FLEXIVA PULS ID 200XHI (Laser) IMPLANT
TRACTIP FLEXIVA PULSE ID 200 (Laser)
TRAY FOLEY MTR SLVR 16FR STAT (SET/KITS/TRAYS/PACK) ×1 IMPLANT
TUBING CONNECTING 10 (TUBING) ×1 IMPLANT
TUBING STONE CATCHER TRILOGY (MISCELLANEOUS) IMPLANT
TUBING UROLOGY SET (TUBING) ×1 IMPLANT
WATER STERILE IRR 1000ML POUR (IV SOLUTION) ×1 IMPLANT

## 2023-01-10 NOTE — Anesthesia Postprocedure Evaluation (Signed)
Anesthesia Post Note  Patient: Lauren [name]  Procedure(s) Performed: LEFT NEPHROLITHOTOMY PERCUTANEOUS (Left)     Patient location during evaluation: PACU Anesthesia Type: General Level of consciousness: awake and alert Pain management: pain level controlled Vital Signs Assessment: post-procedure vital signs reviewed and stable Respiratory status: spontaneous breathing, nonlabored ventilation, respiratory function stable and patient connected to nasal cannula oxygen Cardiovascular status: blood pressure returned to baseline and stable Postop Assessment: no apparent nausea or vomiting Anesthetic complications: no   No notable events documented.  Last Vitals:  Vitals:   01/10/23 1530 01/10/23 1545  BP: (!) 161/98 (!) 153/99  Pulse: 67 73  Resp: 14 (!) 21  Temp:    SpO2: 100% 98%    Last Pain:  Vitals:   01/10/23 1545  TempSrc:   PainSc: 5                  Beryle Lathe

## 2023-01-10 NOTE — Op Note (Signed)
Operative Note  Preoperative diagnosis:  1.  Left renal calculus  Postoperative diagnosis: 1.  Left renal calculus   Procedure(s): 1.  Left percutaneous nephrolithotomy  Surgeon: Modena Slater, MD  Assistants: None  Anesthesia: General  Complications: None immediate  EBL: 50 cc  Specimens: 1.  Renal calculus  Drains/Catheters: 1.  6 x 26 double-J ureteral stent  Intraoperative findings: Patient had a large lower pole calculus.  She had several upper pole calculi.  All visible stone removed.  Indication: 42 year old female with a large left renal calculus presents for the previously mentioned operation.  Description of procedure:  The patient was identified and consent was obtained.  The patient was taken to the operating room and placed in the supine position.  The patient was placed under general anesthesia.  Perioperative antibiotics were administered.  The patient was placed in prone position and all pressure points were padded.  Patient was prepped and draped in a standard sterile fashion and a timeout was performed.  A Super Stiff wire was advanced through the nephroureteral stent down to the bladder under fluoroscopic guidance and the nephroureteral stent was removed.  A dual-lumen ureteral catheter was advanced over the Super Stiff wire into the renal pelvis and an antegrade nephrostogram was performed.  This showed a well opacified kidney and a filling defect corresponding to the stone of interest.  I advanced the dual-lumen ureteral catheter into the proximal ureter under fluoroscopic guidance followed by placement of a second Super Stiff wire down to the bladder under fluoroscopic guidance.  The dual-lumen catheter was removed.  An incision was made alongside the wires.  The balloon dilator was then advanced over one of the wires and into the renal pelvis fluoroscopic guidance and the tract was dilated to a pressure of 18.  The sheath was advanced over the balloon and into  the renal pelvis.  The balloon was withdrawn keeping the sheath in place.  The nephroscope was advanced into the kidney and the stone of interest was encountered.  The stone was then removed with a combination of pneumatic and ultrasound with suction followed by grasper extraction.  I used a flexible cystoscope to access the upper pole.  There were a couple of stones that were basket extracted.  I interrogated the entire kidney and did not identify any other stone fragments.  All stone was removed and there was no evidence of any other stones within the kidney.    A 6 x 26 double-J ureteral stent was advanced over 1 of the wires under fluoroscopic guidance and the wire was withdrawn.  Fluoroscopy confirmed a good coil within the bladder as well as a good coil in the renal pelvis proximally.  The other wire was withdrawn followed by removal of the scope and sheath.  The incision was closed with running 4-0 Monocryl.  This concluded the operation.  Patient tolerated the procedure well and stable postoperatively.  Plan: Patient will remain under observation overnight. Stent to be removed in 1-2 weeks.

## 2023-01-10 NOTE — Procedures (Signed)
Vascular and Interventional Radiology Procedure Note  Patient: Lauren [name] DOB: 07-31-1981 Medical Record Number: [id number] Note Date/Time: 01/10/23 10:49 AM   Performing Physician: Roanna Banning, MD Assistant(s): None  Diagnosis: LEFT staghorn Renal calculus. Planned OR for PCNL  Procedure:  LEFT NEPHROURETERAL ACCESS LEFT ANTEROGRADE NEPHROSTOGRAM  Anesthesia: Conscious Sedation Complications: None Estimated Blood Loss: Minimal Specimens:  None  Findings:  Successful placement of left-sided, 5 F nephroureteral tube into the left kidney(s), with tip within the urinary bladder.  Plan: to OR for PCNL.  See detailed procedure note with images in PACS. The patient tolerated the procedure well without incident or complication and was returned to Recovery in stable condition.    Roanna Banning, MD [facility]   Pager. [phone] Clinic. [phone]

## 2023-01-10 NOTE — Plan of Care (Signed)

## 2023-01-10 NOTE — H&P (Signed)
CC/HPI: CC: Left flank pain, left renal calculus  HPI:  11/30/2022  42 year old female has a history of nephrolithiasis. Has undergone ESWL in the past. Recently underwent a CT scan on 11/16/2022 that revealed a 16 x 12 left lower pole calculus and smaller bilateral renal calculi. No hydronephrosis or ureteral calculi. 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 She admits to some intermittent left-sided flank pain and feeling of discomfort in her left flank. Denies any hematuria, dysuria, UTIs. Does have some urinary frequency but has been drinking a lot of water.   12/06/2022: Last seen 6 days ago. This patient is scheduled for left PCNL on 4/22 with Dr. Alvester Morin. Seen today as an acute visit due to concerns for worsening symptoms. Beginning yesterday, she began developing some increased lower abdominal discomfort described as bladder pressure with some mild increased urgency from baseline. Also with some continued left greater than right flank pain and discomfort. She denies any dysuria or gross hematuria. Denies any interval passage of stone material. Not associated with any fever/chills or nausea/vomiting. UA today with minimal microscopic abnormalities, no MH. She states symptoms do not feel like a true kidney stone event. She is most concerned that she may have developed a UTI but she has not had 1 in such a long time that she is not sure.   01/10/2023 Patient presents today for PCNL.    ALLERGIES: No Allergies    MEDICATIONS: JUNEL FE Daily  Omeprazole PO Daily     GU PSH: No GU PSH      PSH Notes: Litho   NON-GU PSH: Global Fee For Extracorporeal Shock Wave Lithotripsy Treatment Of Kidney Stone(s)     GU PMH: Flank Pain - 11/30/2022 Renal calculus - 11/30/2022      PMH Notes:  1898-09-20 00:00:00 - Note: Normal Routine History And Physical Adult   NON-GU PMH: Anxiety Depression Gout    FAMILY HISTORY: 2 daughters - Other 1 son - Other Diabetes -  Mother End stage renal failure on dialysis - Aunt Heart Disease - Grandmother, Grandfather High Blood Pressure - Mother   SOCIAL HISTORY: Marital Status: Single Current Smoking Status: Patient has never smoked.  Does not use smokeless tobacco. Drinks 2 drinks per month. Social Drinker.  Does not use drugs. Drinks 2 caffeinated drinks per day. Has not had a blood transfusion. Patient's occupation Physicist, medical.    REVIEW OF SYSTEMS:    GU Review Female:   Patient denies frequent urination, hard to postpone urination, burning /pain with urination, get up at night to urinate, leakage of urine, stream starts and stops, trouble starting your stream, have to strain to urinate, and being pregnant.  Gastrointestinal (Upper):   Patient denies nausea, vomiting, and indigestion/ heartburn.  Gastrointestinal (Lower):   Patient denies diarrhea and constipation.  Constitutional:   Patient denies fever, night sweats, weight loss, and fatigue.  Skin:   Patient denies skin rash/ lesion and itching.  Eyes:   Patient denies blurred vision and double vision.  Ears/ Nose/ Throat:   Patient denies sore throat and sinus problems.  Hematologic/Lymphatic:   Patient denies swollen glands and easy bruising.  Cardiovascular:   Patient denies leg swelling and chest pains.  Respiratory:   Patient denies cough and shortness of breath.  Endocrine:   Patient denies excessive thirst.  Musculoskeletal:   Patient denies back pain and joint pain.  Neurological:   Patient denies headaches and dizziness.  Psychologic:   Patient denies depression and anxiety.  BP (!) 167/115   Pulse 85   Temp 98.2 F (36.8 C) (Oral)   Resp 15   LMP 12/27/2022 (Approximate)   SpO2 98%    MULTI-SYSTEM PHYSICAL EXAMINATION:    Constitutional: Well-nourished. No physical deformities. Normally developed. Good grooming.  Neck: Neck symmetrical, not swollen. Normal tracheal position.  Respiratory: No labored breathing, no use of  accessory muscles.   Skin: No paleness, no jaundice, no cyanosis. No lesion, no ulcer, no rash.  Neurologic / Psychiatric: Oriented to time, oriented to place, oriented to person. No depression, no anxiety, no agitation.  Gastrointestinal: No mass, no tenderness, no rigidity, non obese abdomen. No CVA tenderness bilaterally  Musculoskeletal: Normal gait and station of head and neck.       ASSESSMENT:      ICD-10 Details  1 GU:   Renal calculus - N20.0 Chronic, Stable  2   Pelvic/perineal pain - R10.2 Undiagnosed New Problem   PLAN:      Proceed with PCNL.

## 2023-01-10 NOTE — Anesthesia Procedure Notes (Signed)
Procedure Name: Intubation Date/Time: 01/10/2023 12:58 PM  Performed by: Wynonia Sours, CRNAPre-anesthesia Checklist: Patient identified, Emergency Drugs available, Suction available, Patient being monitored and Timeout performed Patient Re-evaluated:Patient Re-evaluated prior to induction Oxygen Delivery Method: Circle system utilized Preoxygenation: Pre-oxygenation with 100% oxygen Induction Type: IV induction Ventilation: Mask ventilation without difficulty Laryngoscope Size: Mac and 3 Grade View: Grade I Tube type: Oral Tube size: 7.0 mm Number of attempts: 1 Airway Equipment and Method: Stylet Placement Confirmation: ETT inserted through vocal cords under direct vision, positive ETCO2, CO2 detector and breath sounds checked- equal and bilateral Secured at: 21 cm Tube secured with: Tape Dental Injury: Teeth and Oropharynx as per pre-operative assessment

## 2023-01-10 NOTE — Transfer of Care (Signed)
Immediate Anesthesia Transfer of Care Note  Patient: Lauren [name]  Procedure(s) Performed: LEFT NEPHROLITHOTOMY PERCUTANEOUS (Left)  Patient Location: PACU  Anesthesia Type:General  Level of Consciousness: awake, alert , oriented, and patient cooperative  Airway & Oxygen Therapy: Patient Spontanous Breathing and Patient connected to face mask oxygen  Post-op Assessment: Report given to RN and Post -op Vital signs reviewed and stable  Post vital signs: Reviewed and stable  Last Vitals:  Vitals Value Taken Time  BP    Temp    Pulse 83 01/10/23 1454  Resp 19 01/10/23 1455  SpO2 98 % 01/10/23 1454  Vitals shown include unvalidated device data.  Last Pain:  Vitals:   01/10/23 0957  TempSrc:   PainSc: 0-No pain         Complications: No notable events documented.

## 2023-01-10 NOTE — Consult Note (Signed)
Chief Complaint: Patient was seen in consultation today for left percutaneous nephrostomy/nephroureteral catheter placement  Referring Physician(s): Bell,E  Supervising Physician: Roanna Banning  Patient Status: [facility] - Out-pt TBA  History of Present Illness: Lauren [name] is a 42 y.o. female with PMH sig for anemia, anxiety, asthma, DM and nephrolithiasis. She has a hx of left flank pain and CT A/P on 11/16/22 which revealed:   Hepatic steatosis. 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 Smaller bilateral renal calculi are noted as well. No hydronephrosis or renal obstruction is noted.   She is scheduled today for left percutaneous nephrostomy/nephroureteral catheter placement prior to nephrolithotomy.  Past Medical History:  Diagnosis Date   Anemia    Anxiety    no meds currently   Asthma    Diabetes mellitus without complication    Gestational   Family history of adverse reaction to anesthesia    Hx of preterm delivery, currently pregnant    Hx of varicella    Kidney stone 09/20/2012   Postpartum depression    took medication for short time    Past Surgical History:  Procedure Laterality Date   LITHOTRIPSY     NO PAST SURGERIES     WISDOM TOOTH EXTRACTION      Allergies: Patient has no known allergies.  Medications: Prior to Admission medications   Medication Sig Start Date End Date Taking? Authorizing Provider  ibuprofen (ADVIL) 200 MG tablet Take 400 mg by mouth every 6 (six) hours as needed.   Yes [provider]  JUNEL FE 1.5/30 1.5-30 MG-MCG tablet Take 1 tablet by mouth daily. 10/24/21  Yes [provider]  omeprazole (PRILOSEC) 20 MG capsule Take 20 mg by mouth daily as needed (indigestion).   Yes [provider]  tamsulosin (FLOMAX) 0.4 MG CAPS capsule Take 1 capsule (0.4 mg total) by mouth daily. Patient not  taking: Reported on 12/28/2022 06/30/22   Tommie Sams, DO     Family History  Problem Relation Age of Onset   Stroke Mother    Hypertension Mother    Hyperlipidemia Mother    Diabetes Mother    Hearing loss Father    Heart disease Father    Anxiety disorder Daughter    Alcohol abuse Maternal Grandfather    Heart disease Maternal Grandfather    Heart attack Paternal Grandmother    Heart failure Paternal Grandmother    Diabetes Paternal Grandmother    Obesity Paternal Grandmother    Heart disease Paternal Grandmother    Cancer Paternal Grandmother        throat   Heart failure Paternal Grandfather    Hearing loss Paternal Grandfather    Heart disease Paternal Grandfather    Heart attack Paternal Grandfather     Social History   Socioeconomic History   Marital status: Divorced    Spouse name: Not on file   Number of children: 3   Years of education: 14   Highest education level: Some college, no degree  Occupational History   Not on file  Tobacco Use   Smoking status: Never   Smokeless tobacco: Never  Vaping Use   Vaping Use: Never used  Substance and Sexual Activity   Alcohol use: Yes    Alcohol/week: 2.0 standard drinks of alcohol    Types: 2 Cans of beer per week    Comment: occasional   Drug use: No  Sexual activity: Yes    Birth control/protection: Pill  Other Topics Concern   Not on file  Social History Narrative   Not on file   Social Determinants of Health   Financial Resource Strain: Patient Declined (12/26/2022)   Overall Financial Resource Strain (CARDIA)    Difficulty of Paying Living Expenses: Patient declined  Food Insecurity: Patient Declined (12/26/2022)   Hunger Vital Sign    Worried About Running Out of Food in the Last Year: Patient declined    Ran Out of Food in the Last Year: Patient declined  Transportation Needs: No Transportation Needs (12/26/2022)   PRAPARE - Administrator, Civil Service (Medical): No    Lack of  Transportation (Non-Medical): No  Physical Activity: Insufficiently Active (12/26/2022)   Exercise Vital Sign    Days of Exercise per Week: 1 day    Minutes of Exercise per Session: 20 min  Stress: Stress Concern Present (12/26/2022)   [employer] of Occupational Health - Occupational Stress Questionnaire    Feeling of Stress : Rather much  Social Connections: Moderately Integrated (12/26/2022)   Social Connection and Isolation Panel [NHANES]    Frequency of Communication with Friends and Family: Twice a week    Frequency of Social Gatherings with Friends and Family: Once a week    Attends Religious Services: More than 4 times per year    Active Member of [employer] or Organizations: Yes    Attends Engineer, structural: More than 4 times per year    Marital Status: Divorced      Review of Systems denies fever,HA,CP, dyspnea, cough, abd pain, N/V or bleeding; she has had some left flank discomfort and is very anxious  Vital Signs: BP (!) 167/115   Pulse 85   Temp 98.2 F (36.8 C) (Oral)   Resp 15   LMP 12/27/2022 (Approximate)   SpO2 98%     Physical Exam: awake/alert; chest- CTA bilat; heart- RRR; abd- soft,+BS,NT; no LE edema  Imaging: No results found.  Labs:  CBC: Recent Labs    12/30/22 1023  WBC 7.3  HGB 12.5  HCT 38.9  PLT 275    COAGS: No results for input(s): "INR", "APTT" in the last 8760 hours.  BMP: Recent Labs    12/30/22 1023  NA 139  K 3.8  CL 104  CO2 27  GLUCOSE 157*  BUN 16  CALCIUM 9.1  CREATININE 1.11*  GFRNONAA >60    LIVER FUNCTION TESTS: No results for input(s): "BILITOT", "AST", "ALT", "ALKPHOS", "PROT", "ALBUMIN" in the last 8760 hours.  TUMOR MARKERS: No results for input(s): "AFPTM", "CEA", "CA199", "CHROMGRNA" in the last 8760 hours.  Assessment and Plan: 42 y.o. female with PMH sig for anemia, anxiety, asthma, DM and nephrolithiasis. She has a hx of left flank pain and CT A/P on 11/16/22 which revealed:    Hepatic steatosis. 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 Smaller bilateral renal calculi are noted as well. No hydronephrosis or renal obstruction is noted.   She is scheduled today for left percutaneous nephrostomy/nephroureteral catheter placement prior to nephrolithotomy.Risks and benefits of left PCN placement was discussed with the patient/sig other including, but not limited to, infection, bleeding, significant bleeding causing loss or decrease in renal function or damage to adjacent structures.   All of the patient's questions were answered, patient is agreeable to proceed.  Consent signed and in  chart.      Thank you for this interesting consult.  I greatly enjoyed meeting Xin H Stach and look forward to participating in their care.  A copy of this report was sent to the requesting provider on this date.  Electronically Signed: D. Jeananne Rama, PA-C 01/10/2023, 10:04 AM   I spent a total of  25 minutes   in face to face in clinical consultation, greater than 50% of which was counseling/coordinating care for left percutaneous nephrostomy/nephroureteral catheter placement

## 2023-01-10 NOTE — Discharge Instructions (Signed)
Discharge instructions following PCNL ° °Call your doctor for: °Fevers greater than 100.5 °Severe nausea or vomiting °Increasing pain not controlled by pain medication °Increasing redness or drainage from incisions °Decreased urine output or a catheter is no longer draining ° °The number for questions is [phone]. ° °Activity: °Gradually increase activity with short frequent walks, 3-4 times a day.  Avoid strenuous activities, like sports, lawn-mowing, or heavy lifting (more than 10-15 pounds).  Wear loose, comfortable clothing that pull or kink the tube or tubes.  Do not drive while taking pain medication, or until your doctor permitts it. ° °Bathing and dressing changes: °You should not shower for 48 hours after surgery.  Do not soak your back in a bathtub. ° °Drainage bag care: °You may be discharged with a drainage bag around the site of your surgery.  The drainage bag should be secured such that it never pulls or loosens to prevent it from leaking.  It is important to wash her hands before and after emptying the drainage bag to help prevent the spread of infection.  The drainage bag should be emptied as needed.  When the wound stops draining or it is manageable with a dry gauze dressing, you can remove the bag. ° °If your tube in the back was removed, you should expect to have some leakage of fluid from the back incision.  This should slowly decrease and stop over the next couple of days.  If you have severe pain or persistent leakage, please call the number above.  Otherwise, your dressing can be changed 1-2 times daily or more if needed. ° °Diet: °It is extremely important to drink plenty of fluids after surgery, especially water.  You may resume your regular diet, unless otherwise instructed. ° °Medications: °May take Tylenol (acetaminophen) or ibuprofen (Advil, Motrin) as directed over-the-counter. °Take any prescriptions as directed. ° °Follow-up appointments: °Follow-up appointment will be scheduled  with Dr. Shaylin Blatt °

## 2023-01-10 NOTE — Anesthesia Preprocedure Evaluation (Addendum)
Anesthesia Evaluation  Patient identified by MRN, date of birth, ID band Patient awake    Reviewed: Allergy & Precautions, NPO status , Patient's Chart, lab work & pertinent test results  History of Anesthesia Complications Negative for: history of anesthetic complications  Airway Mallampati: I  TM Distance: >3 FB Neck ROM: Full    Dental  (+) Dental Advisory Given, Teeth Intact   Pulmonary asthma (does not use inhalers)    breath sounds clear to auscultation       Cardiovascular hypertension (untreated),  Rhythm:Regular Rate:Normal     Neuro/Psych   Anxiety Depression    negative neurological ROS     GI/Hepatic negative GI ROS, Neg liver ROS,,,  Endo/Other  diabetes    Renal/GU stones     Musculoskeletal   Abdominal   Peds  Hematology negative hematology ROS (+)   Anesthesia Other Findings   Reproductive/Obstetrics OCPs                             Anesthesia Physical Anesthesia Plan  ASA: 3  Anesthesia Plan: General   Post-op Pain Management: Tylenol PO (pre-op)*   Induction: Intravenous  PONV Risk Score and Plan: 3 and Ondansetron, Dexamethasone, Scopolamine patch - Pre-op and Treatment may vary due to age or medical condition  Airway Management Planned: Oral ETT  Additional Equipment: None  Intra-op Plan:   Post-operative Plan: Extubation in OR  Informed Consent: I have reviewed the patients History and Physical, chart, labs and discussed the procedure including the risks, benefits and alternatives for the proposed anesthesia with the patient or authorized representative who has indicated his/her understanding and acceptance.     Dental advisory given  Plan Discussed with:   Anesthesia Plan Comments:        Anesthesia Quick Evaluation

## 2023-01-11 ENCOUNTER — Other Ambulatory Visit: Payer: Self-pay

## 2023-01-11 ENCOUNTER — Encounter (HOSPITAL_COMMUNITY): Payer: Self-pay | Admitting: Urology

## 2023-01-11 DIAGNOSIS — K760 Fatty (change of) liver, not elsewhere classified: Secondary | ICD-10-CM | POA: Diagnosis not present

## 2023-01-11 DIAGNOSIS — R102 Pelvic and perineal pain: Secondary | ICD-10-CM | POA: Diagnosis not present

## 2023-01-11 DIAGNOSIS — Z8249 Family history of ischemic heart disease and other diseases of the circulatory system: Secondary | ICD-10-CM | POA: Diagnosis not present

## 2023-01-11 DIAGNOSIS — F411 Generalized anxiety disorder: Secondary | ICD-10-CM | POA: Diagnosis not present

## 2023-01-11 DIAGNOSIS — N179 Acute kidney failure, unspecified: Secondary | ICD-10-CM | POA: Diagnosis not present

## 2023-01-11 DIAGNOSIS — Z793 Long term (current) use of hormonal contraceptives: Secondary | ICD-10-CM | POA: Diagnosis not present

## 2023-01-11 DIAGNOSIS — N200 Calculus of kidney: Secondary | ICD-10-CM | POA: Diagnosis not present

## 2023-01-11 DIAGNOSIS — J45909 Unspecified asthma, uncomplicated: Secondary | ICD-10-CM | POA: Diagnosis not present

## 2023-01-11 DIAGNOSIS — R109 Unspecified abdominal pain: Secondary | ICD-10-CM | POA: Diagnosis not present

## 2023-01-11 DIAGNOSIS — Z833 Family history of diabetes mellitus: Secondary | ICD-10-CM | POA: Diagnosis not present

## 2023-01-11 DIAGNOSIS — Z87442 Personal history of urinary calculi: Secondary | ICD-10-CM | POA: Diagnosis not present

## 2023-01-11 DIAGNOSIS — E119 Type 2 diabetes mellitus without complications: Secondary | ICD-10-CM | POA: Diagnosis not present

## 2023-01-11 DIAGNOSIS — D72829 Elevated white blood cell count, unspecified: Secondary | ICD-10-CM | POA: Diagnosis not present

## 2023-01-11 DIAGNOSIS — Z79899 Other long term (current) drug therapy: Secondary | ICD-10-CM | POA: Diagnosis not present

## 2023-01-11 LAB — BASIC METABOLIC PANEL
Anion gap: 14 (ref 5–15)
BUN: 16 mg/dL (ref 6–20)
CO2: 19 mmol/L — ABNORMAL LOW (ref 22–32)
Calcium: 7.9 mg/dL — ABNORMAL LOW (ref 8.9–10.3)
Chloride: 100 mmol/L (ref 98–111)
Creatinine, Ser: 1.77 mg/dL — ABNORMAL HIGH (ref 0.44–1.00)
GFR, Estimated: 36 mL/min — ABNORMAL LOW (ref >60)
Glucose, Bld: 118 mg/dL — ABNORMAL HIGH (ref 70–99)
Potassium: 4.1 mmol/L (ref 3.5–5.1)
Sodium: 133 mmol/L — ABNORMAL LOW (ref 135–145)

## 2023-01-11 LAB — HEMOGLOBIN AND HEMATOCRIT, BLOOD
HCT: 37.1 % (ref 36.0–46.0)
Hemoglobin: 11.9 g/dL — ABNORMAL LOW (ref 12.0–15.0)

## 2023-01-11 MED ORDER — CHLORHEXIDINE GLUCONATE CLOTH 2 % EX PADS
6.0000 | MEDICATED_PAD | Freq: Every day | CUTANEOUS | Status: DC
  Administered 2023-01-13: 6 via TOPICAL

## 2023-01-11 NOTE — Progress Notes (Cosign Needed Addendum)
1 Day Post-Op Subjective: No acute events overnight.  Patient was resting in bed accompanied by her husband on rounds.  She reported that her pain was relatively well-managed with IV medications but she had been experiencing nausea with the oral variant's.   Objective: Vital signs in last 24 hours: Temp:  [97.3 F (36.3 C)-99.1 F (37.3 C)] 97.6 F (36.4 C) (04/23 0507) Pulse Rate:  [62-86] 86 (04/23 0507) Resp:  [14-21] 18 (04/23 0507) BP: (137-167)/(84-106) 148/93 (04/23 0507) SpO2:  [95 %-100 %] 96 % (04/23 0507)  Intake/Output from previous day: 04/22 0701 - 04/23 0700 In: 3241.2 [P.O.:120; I.V.:3121.2] Out: 1100 [Urine:1000; Blood:100]  Intake/Output this shift: No intake/output data recorded.  Physical Exam:  General: Alert and oriented CV: RRR, palpable distal pulses Lungs: CTAB, equal chest rise Abdomen: Soft, NTND, no rebound or guarding Gu: clear yellow urine Ext: NT, No erythema  Lab Results: Recent Labs    01/10/23 1022 01/10/23 1802 01/11/23 0551  HGB 12.8 13.2 11.9*  HCT 39.6 40.8 37.1   BMET Recent Labs    01/10/23 1802 01/11/23 0551  NA 138 133*  K 3.7 4.1  CL 102 100  CO2 21* 19*  GLUCOSE 142* 118*  BUN 12 16  CREATININE 1.28* 1.77*  CALCIUM 8.2* 7.9*     Studies/Results: DG C-Arm 1-60 Min-No Report  Result Date: 01/10/2023 Fluoroscopy was utilized by the requesting physician.  No radiographic interpretation.   DG C-Arm 1-60 Min-No Report  Result Date: 01/10/2023 Fluoroscopy was utilized by the requesting physician.  No radiographic interpretation.   IR URETERAL STENT LEFT NEW ACCESS W/O SEP NEPHROSTOMY CATH  Result Date: 01/10/2023 INDICATION: LEFT staghorn calculus. Access for LEFT percutaneous nephrolithotomy. EXAM: LEFT NEPHROURETERAL ACCESS AND CATHETER PLACEMENT FOR OPERATIVE NEPHROLITHOTOMY COMPARISON:  CT urogram, 11/16/2022.  KUB, 12/06/2022. MEDICATIONS: Rocephin 2 gm IV; The antibiotic was administered in an appropriate  time frame prior to skin puncture. ANESTHESIA/SEDATION: Moderate (conscious) sedation was employed during this procedure. A total of Versed 8 mg and Fentanyl 200 mcg was administered intravenously. Moderate Sedation Time: 64 minutes. The patient's level of consciousness and vital signs were monitored continuously by radiology nursing throughout the procedure under my direct supervision. CONTRAST:  50mL OMNIPAQUE IOHEXOL 300 MG/ML SOLN, 10mL OMNIPAQUE IOHEXOL 300 MG/ML SOLN - Administered into the renal collecting system. FLUOROSCOPY TIME:  Fluoroscopic dose; 41 mGy COMPLICATIONS: None immediate. PROCEDURE: Informed written consent was obtained from the patient after a discussion of the risks, benefits, and alternatives to treatment. The LEFT flank region was prepped with chlorhexidine in a sterile fashion, and a sterile drape was applied covering the operative field. A sterile gown and sterile gloves were used for the procedure. A timeout was performed prior to the initiation of the procedure. A pre procedural spot fluoroscopic image was obtained of the upper abdomen. Ultrasound scanning performed of the kidney was revealing nephrocalcinosis and no appreciable hydronephrosis. The LEFT posterior inferolateral renal collecting system was targeted sonographically with a 21 gauge Chiba needle. An image was saved for documentation purposes. Access was confirmed with advancement of a Nitrex wire into the collecting system. The needle was exchanged for the inner 3 Fr catheter from an Accustick set and contrast injection confirmed access. The Accustick set was utilized to dilate the tract and was subsequently exchanged for a Kumpe catheter over a 0.035 inch glide wire. The Kumpe catheter was advanced down the ureter and into the urinary bladder. Postprocedural spot radiographs were obtained in various obliquities and the catheter was  sutured to the skin. The catheter was capped and a dressing was placed. The patient  tolerated the procedure well without immediate post procedural complication. FINDINGS: *Sonographic evaluation of the LEFT kidney demonstrates echogenic medullary pyramids consistent with nephrocalcinosis and no hydronephrosis. *Pre procedural spot radiographic images demonstrates LEFT inferior renal collecting system small staghorn calculus. *Challenging access, with difficulty in wire placement into the posteroinferior collecting system. *Successful access and placement of a Kumpe catheter through the inferolateral calyx with tip advanced into the urinary bladder. IMPRESSION: Successful nephroureteral access and placement of a LEFT sided 5 Fr Kumpe catheter to the level of the urinary bladder for planned nephrolithotomy Roanna Banning, MD [facility] Radiology Electronically Signed   By: Roanna Banning [name]   On: 01/10/2023 13:23    Assessment/Plan: Left nephrolithiasis -S/p left percutaneous nephrolithotomy with Dr. Alvester Morin on 01/10/2023. -Interval elevation of serum creatinine postoperatively-1.77.  Not an unexpected finding.  Will continue IV hydration and monitor daily labs.  I expect this to be self correcting over the next couple of days. -WBC and Hgb within normal range -Still having some difficulty with flank pain and unable to tolerate oral narcotics.  We discussed the obvious inability to discharge her home with IV medications would be a limiting factor.  Patient is agreed to trial oral nonnarcotic analgesics as much as tolerated. -Will reconsider discharge tomorrow pending pain control.   LOS: 0 days   Elmon Kirschner, NP [facility]  01/11/2023, 12:50 PM

## 2023-01-11 NOTE — TOC CM/SW Note (Signed)
  Transition of Care [facility]) Screening Note   Patient Details  Name: Lauren [name] Date of Birth: 12/12/1980   Transition of Care [facility]) CM/SW Contact:    Howell Rucks, RN Phone Number: 01/11/2023, 1:11 PM    Transition of Care Department [facility]) has reviewed patient and no TOC needs have been identified at this time. We will continue to monitor patient advancement through interdisciplinary progression rounds. If new patient transition needs arise, please place a TOC consult.

## 2023-01-12 LAB — CBC
HCT: 38.3 % (ref 36.0–46.0)
Hemoglobin: 12.2 g/dL (ref 12.0–15.0)
MCH: 28.1 pg (ref 26.0–34.0)
MCHC: 31.9 g/dL (ref 30.0–36.0)
MCV: 88.2 fL (ref 80.0–100.0)
Platelets: 295 10*3/uL (ref 150–400)
RBC: 4.34 MIL/uL (ref 3.87–5.11)
RDW: 12.5 % (ref 11.5–15.5)
WBC: 17.3 10*3/uL — ABNORMAL HIGH (ref 4.0–10.5)
nRBC: 0 % (ref 0.0–0.2)

## 2023-01-12 LAB — BASIC METABOLIC PANEL
Anion gap: 9 (ref 5–15)
BUN: 18 mg/dL (ref 6–20)
CO2: 22 mmol/L (ref 22–32)
Calcium: 8.7 mg/dL — ABNORMAL LOW (ref 8.9–10.3)
Chloride: 102 mmol/L (ref 98–111)
Creatinine, Ser: 1.49 mg/dL — ABNORMAL HIGH (ref 0.44–1.00)
GFR, Estimated: 45 mL/min — ABNORMAL LOW (ref >60)
Glucose, Bld: 128 mg/dL — ABNORMAL HIGH (ref 70–99)
Potassium: 3.9 mmol/L (ref 3.5–5.1)
Sodium: 133 mmol/L — ABNORMAL LOW (ref 135–145)

## 2023-01-12 MED ORDER — ONDANSETRON HCL 4 MG/2ML IJ SOLN
4.0000 mg | Freq: Four times a day (QID) | INTRAMUSCULAR | Status: DC | PRN
  Administered 2023-01-12: 4 mg via INTRAVENOUS

## 2023-01-12 MED ORDER — ONDANSETRON 4 MG PO TBDP: 4.0000 mg | ORAL_TABLET | Freq: Four times a day (QID) | ORAL | Status: DC | PRN

## 2023-01-12 MED ORDER — POLYETHYLENE GLYCOL 3350 17 G PO PACK
17.0000 g | PACK | Freq: Every day | ORAL | Status: DC
  Administered 2023-01-12 – 2023-01-13 (×2): 17 g via ORAL
  Filled 2023-01-12 (×2): qty 1

## 2023-01-12 MED ORDER — HYDROMORPHONE HCL 2 MG PO TABS
2.0000 mg | ORAL_TABLET | ORAL | Status: DC | PRN
  Administered 2023-01-12 – 2023-01-13 (×4): 2 mg via ORAL
  Filled 2023-01-12 (×4): qty 1

## 2023-01-12 NOTE — Progress Notes (Addendum)
2 Days Post-Op Subjective: No acute events overnight.  Still significant pain but improved.  She was up in the chair on my arrival.  She reports a history of anxiety/panic which she is not presently treated for.  She reports that the anxiety in anticipation of her upcoming surgery exacerbated the symptoms and she is continuing to experience some panic while in hospital.  Objective: Vital signs in last 24 hours: Temp:  [97.9 F (36.6 C)-98.1 F (36.7 C)] 97.9 F (36.6 C) (04/24 0335) Pulse Rate:  [78-89] 89 (04/24 0335) Resp:  [18-20] 20 (04/24 0335) BP: (149-150)/(91-98) 150/98 (04/24 0335) SpO2:  [97 %-98 %] 97 % (04/24 0335)  Intake/Output from previous day: 04/23 0701 - 04/24 0700 In: 1160 [P.O.:1160] Out: 1170 [Urine:1170]  Intake/Output this shift: No intake/output data recorded.  Physical Exam:  General: Alert and oriented CV: RRR, palpable distal pulses Lungs: CTAB, equal chest rise Abdomen: Soft, NTND, no rebound or guarding Gu: clear yellow urine Ext: NT, No erythema  Lab Results: Recent Labs    01/10/23 1802 01/11/23 0551 01/12/23 0827  HGB 13.2 11.9* 12.2  HCT 40.8 37.1 38.3   BMET Recent Labs    01/11/23 0551 01/12/23 0827  NA 133* 133*  K 4.1 3.9  CL 100 102  CO2 19* 22  GLUCOSE 118* 128*  BUN 16 18  CREATININE 1.77* 1.49*  CALCIUM 7.9* 8.7*     Studies/Results: DG C-Arm 1-60 Min-No Report  Result Date: 01/10/2023 Fluoroscopy was utilized by the requesting physician.  No radiographic interpretation.   DG C-Arm 1-60 Min-No Report  Result Date: 01/10/2023 Fluoroscopy was utilized by the requesting physician.  No radiographic interpretation.   IR URETERAL STENT LEFT NEW ACCESS W/O SEP NEPHROSTOMY CATH  Result Date: 01/10/2023 INDICATION: LEFT staghorn calculus. Access for LEFT percutaneous nephrolithotomy. EXAM: LEFT NEPHROURETERAL ACCESS AND CATHETER PLACEMENT FOR OPERATIVE NEPHROLITHOTOMY COMPARISON:  CT urogram, 11/16/2022.  KUB,  12/06/2022. MEDICATIONS: Rocephin 2 gm IV; The antibiotic was administered in an appropriate time frame prior to skin puncture. ANESTHESIA/SEDATION: Moderate (conscious) sedation was employed during this procedure. A total of Versed 8 mg and Fentanyl 200 mcg was administered intravenously. Moderate Sedation Time: 64 minutes. The patient's level of consciousness and vital signs were monitored continuously by radiology nursing throughout the procedure under my direct supervision. CONTRAST:  50mL OMNIPAQUE IOHEXOL 300 MG/ML SOLN, 10mL OMNIPAQUE IOHEXOL 300 MG/ML SOLN - Administered into the renal collecting system. FLUOROSCOPY TIME:  Fluoroscopic dose; 41 mGy COMPLICATIONS: None immediate. PROCEDURE: Informed written consent was obtained from the patient after a discussion of the risks, benefits, and alternatives to treatment. The LEFT flank region was prepped with chlorhexidine in a sterile fashion, and a sterile drape was applied covering the operative field. A sterile gown and sterile gloves were used for the procedure. A timeout was performed prior to the initiation of the procedure. A pre procedural spot fluoroscopic image was obtained of the upper abdomen. Ultrasound scanning performed of the kidney was revealing nephrocalcinosis and no appreciable hydronephrosis. The LEFT posterior inferolateral renal collecting system was targeted sonographically with a 21 gauge Chiba needle. An image was saved for documentation purposes. Access was confirmed with advancement of a Nitrex wire into the collecting system. The needle was exchanged for the inner 3 Fr catheter from an Accustick set and contrast injection confirmed access. The Accustick set was utilized to dilate the tract and was subsequently exchanged for a Kumpe catheter over a 0.035 inch glide wire. The Kumpe catheter was advanced down  the ureter and into the urinary bladder. Postprocedural spot radiographs were obtained in various obliquities and the catheter  was sutured to the skin. The catheter was capped and a dressing was placed. The patient tolerated the procedure well without immediate post procedural complication. FINDINGS: *Sonographic evaluation of the LEFT kidney demonstrates echogenic medullary pyramids consistent with nephrocalcinosis and no hydronephrosis. *Pre procedural spot radiographic images demonstrates LEFT inferior renal collecting system small staghorn calculus. *Challenging access, with difficulty in wire placement into the posteroinferior collecting system. *Successful access and placement of a Kumpe catheter through the inferolateral calyx with tip advanced into the urinary bladder. IMPRESSION: Successful nephroureteral access and placement of a LEFT sided 5 Fr Kumpe catheter to the level of the urinary bladder for planned nephrolithotomy Roanna Banning, MD [facility] Electronically Signed   By: Roanna Banning [name]   On: 01/10/2023 13:23    Assessment/Plan: Left nephrolithiasis -S/p left percutaneous nephrolithotomy with Dr. Alvester Morin on 01/10/2023. -Interval improvement of SCr.  1.49, (1.77). will continue IV hydration and monitor daily labs.  I expect this to be self correcting over the next couple of days. -WBC 17.3.  Presumably reactive.  No signs of systemic infection -She continues to struggle with pain.  Thankfully we have been able to tolerate more oral medications due to coadministration of Zofran with the oxycodone.  She is still unsure of her ability to complete her activities of daily living and would like to take the morning to rest and reevaluate discharge this afternoon.  General anxiety -Carries history of anxiety/panic, previously treated with Lexapro.  Presently off treatment.  In the interest of keeping her awake and participating in her recovery, the shared decision was made to defer antianxiety medications while she is requiring narcotic pain medication.   LOS: 1  day   Elmon Kirschner, NP [facility]  01/12/2023, 9:31 AM    Seen and examined. Agree with assessment and plan. Continuing to struggle with pain. Will continue to work on this. DC once pain controlled. We will plan to move stent pull appt up to friday

## 2023-01-13 DIAGNOSIS — R110 Nausea: Secondary | ICD-10-CM

## 2023-01-13 LAB — BASIC METABOLIC PANEL
Anion gap: 8 (ref 5–15)
BUN: 12 mg/dL (ref 6–20)
CO2: 25 mmol/L (ref 22–32)
Calcium: 8.4 mg/dL — ABNORMAL LOW (ref 8.9–10.3)
Chloride: 104 mmol/L (ref 98–111)
Creatinine, Ser: 0.89 mg/dL (ref 0.44–1.00)
GFR, Estimated: >60 mL/min (ref >60)
Glucose, Bld: 112 mg/dL — ABNORMAL HIGH (ref 70–99)
Potassium: 3.6 mmol/L (ref 3.5–5.1)
Sodium: 137 mmol/L (ref 135–145)

## 2023-01-13 LAB — CBC
HCT: 35.3 % — ABNORMAL LOW (ref 36.0–46.0)
Hemoglobin: 11.4 g/dL — ABNORMAL LOW (ref 12.0–15.0)
MCH: 28.7 pg (ref 26.0–34.0)
MCHC: 32.3 g/dL (ref 30.0–36.0)
MCV: 88.9 fL (ref 80.0–100.0)
Platelets: 266 10*3/uL (ref 150–400)
RBC: 3.97 MIL/uL (ref 3.87–5.11)
RDW: 12.5 % (ref 11.5–15.5)
WBC: 13.4 10*3/uL — ABNORMAL HIGH (ref 4.0–10.5)
nRBC: 0 % (ref 0.0–0.2)

## 2023-01-13 MED ORDER — ONDANSETRON HCL 4 MG PO TABS: 4.0000 mg | ORAL_TABLET | Freq: Four times a day (QID) | ORAL | 0 refills | Status: AC | PRN

## 2023-01-13 NOTE — Plan of Care (Signed)
  Problem: Activity: Goal: Risk for activity intolerance will decrease Outcome: Adequate for Discharge   Problem: Coping: Goal: Level of anxiety will decrease Outcome: Adequate for Discharge   Problem: Elimination: Goal: Will not experience complications related to bowel motility Outcome: Adequate for Discharge Goal: Will not experience complications related to urinary retention Outcome: Adequate for Discharge   Problem: Pain Managment: Goal: General experience of comfort will improve Outcome: Adequate for Discharge

## 2023-01-13 NOTE — Progress Notes (Signed)
D/C instructions reviewed with pt and SO. Both verbalize understanding and all questions answered. Pt in possession of d/c packet and all personal belongings. Pt d/c in w/c in stable condition to SO's car.

## 2023-01-13 NOTE — Discharge Summary (Signed)
Lauren [name] is a 42 year old female known to our practice and presenting to [facility] for scheduled percutaneous nephrolithotomy with Dr. Modena Slater.  Procedure was completed without complication and patient was admitted to the floor for labs, monitoring, and pain management overnight.  The following morning she was doing well medically but continued to struggle with pain and required IV medication, delaying her discharge.  She continued to be monitored for 3 days total.  During this time transient and expected AKI resolved.  Her leukocytosis has nearly normalized and is likely reactive.  She has not required antibiotics and has no sign of systemic infection.  She understands to contact the clinic for signs and symptoms of infection including fever greater than 101.4.  She has been ambulating and tolerating a diet.  All postoperative milestones have been met and patient is eager to discharge home.  Much of her difficulty was probably coming from stent colic and as a result we will have her in clinic midday tomorrow for stent removal.

## 2023-01-17 DIAGNOSIS — N200 Calculus of kidney: Secondary | ICD-10-CM | POA: Diagnosis not present

## 2023-01-27 ENCOUNTER — Ambulatory Visit (HOSPITAL_COMMUNITY)
Admission: RE | Admit: 2023-01-27 | Discharge: 2023-01-27 | Disposition: A | Discharge status: Home / Self Care | Payer: Medicaid Other | Source: Ambulatory Visit | Attending: Emergency Medicine | Admitting: Emergency Medicine

## 2023-01-27 ENCOUNTER — Emergency Department (HOSPITAL_COMMUNITY)
Admission: EM | Admit: 2023-01-27 | Discharge: 2023-01-27 | Disposition: A | Discharge status: Home / Self Care | Payer: Medicaid Other | Attending: Emergency Medicine | Admitting: Emergency Medicine

## 2023-01-27 ENCOUNTER — Other Ambulatory Visit (HOSPITAL_COMMUNITY): Payer: Self-pay | Admitting: Obstetrics and Gynecology

## 2023-01-27 ENCOUNTER — Other Ambulatory Visit: Payer: Self-pay

## 2023-01-27 DIAGNOSIS — I808 Phlebitis and thrombophlebitis of other sites: Secondary | ICD-10-CM | POA: Insufficient documentation

## 2023-01-27 DIAGNOSIS — I8002 Phlebitis and thrombophlebitis of superficial vessels of left lower extremity: Secondary | ICD-10-CM | POA: Insufficient documentation

## 2023-01-27 DIAGNOSIS — R600 Localized edema: Secondary | ICD-10-CM | POA: Diagnosis not present

## 2023-01-27 DIAGNOSIS — Z1231 Encounter for screening mammogram for malignant neoplasm of breast: Secondary | ICD-10-CM

## 2023-01-27 DIAGNOSIS — M79602 Pain in left arm: Secondary | ICD-10-CM | POA: Diagnosis present

## 2023-01-27 NOTE — Discharge Instructions (Signed)
Apply warm compresses to the area on and off.  Take prescription strength doses of ibuprofen (600 mg) 3 times a day for the next week.  Return for venous ultrasound later this morning to make sure there is not a deep clot that would require prescription anticoagulation.

## 2023-01-27 NOTE — ED Provider Notes (Signed)
[place] EMERGENCY DEPARTMENT AT [facility] Provider Note   CSN: [id number] Arrival date & time: 01/27/23  0414     History  Chief Complaint  Patient presents with   Arm Pain    Lauren [name] is a 42 y.o. female.  Patient presents to the emergency department for evaluation of pain and swelling of the left arm.  Patient had a urology procedure 2 weeks ago.  She was in the hospital for 3 days and had an IV in the mid volar forearm.  Over the last 24 hours she has started to notice some pain and swelling in the forearm and elbow area.       Home Medications Prior to Admission medications   Medication Sig Start Date End Date Taking? Authorizing Provider  HYDROcodone-acetaminophen (NORCO/VICODIN) 5-325 MG tablet Take 1 tablet by mouth every 4 (four) hours as needed for moderate pain. 01/10/23 01/10/24  Ray Church III, MD  ibuprofen (ADVIL) 200 MG tablet Take 400 mg by mouth every 6 (six) hours as needed.    [provider]  JUNEL FE 1.5/30 1.5-30 MG-MCG tablet Take 1 tablet by mouth daily. 10/24/21   [provider]  omeprazole (PRILOSEC) 20 MG capsule Take 20 mg by mouth daily as needed (indigestion).    [provider]  tamsulosin (FLOMAX) 0.4 MG CAPS capsule Take 1 capsule (0.4 mg total) by mouth daily. 01/10/23   Crista Elliot, MD      Allergies    Patient has no known allergies.    Review of Systems   Review of Systems  Physical Exam Updated Vital Signs BP (!) 144/95   Pulse 91   Temp 97.8 F (36.6 C)   Resp 19   Ht 5\' 5"  (1.651 m)   Wt 78.1 kg   LMP 12/27/2022 (Approximate)   SpO2 98%   BMI 28.65 kg/m  Physical Exam Constitutional:      Appearance: Normal appearance.  HENT:     Head: Atraumatic.  Eyes:     Pupils: Pupils are equal, round, and reactive to light.  Cardiovascular:     Rate and Rhythm: Normal rate.  Pulmonary:     Effort: Pulmonary effort is normal.  Musculoskeletal:     Comments: Palpable  superficial veins medial aspect of elbow, tender  No overlying erythema or skin warmth to suggest infection  Skin:    Findings: No erythema.  Neurological:     Mental Status: She is alert.     ED Results / Procedures / Treatments   Labs (all labs ordered are listed, but only abnormal results are displayed) Labs Reviewed - No data to display  EKG None  Radiology No results found.  Procedures Procedures    Medications Ordered in ED Medications - No data to display  ED Course/ Medical Decision Making/ A&P                             Medical Decision Making  Patient presents to the emergency department for evaluation of pain in her left arm.  She does have a single superficial vein that is palpable at the medial aspect of the elbow.  She had a forearm IV in place for 3 days 2 weeks ago when she was in the hospital.  This is most consistent with a superficial phlebitis.  No overlying erythema or warmth to suggest superinfection.  Unclear why it took 2 weeks for  this to develop, will perform formal venous duplex this morning to rule out DVT, but doubt there is an associated DVT.        Final Clinical Impression(s) / ED Diagnoses Final diagnoses:  Phlebitis of arm    Rx / DC Orders ED Discharge Orders          Ordered    US Venous Img Upper Uni Left        01/27/23 0445              Gilda Crease, MD 01/27/23 [phone]

## 2023-01-27 NOTE — ED Triage Notes (Addendum)
Pt states she had nephrolithotomy on 4/22 and was d/c'd on 4/25. During her stay she had an IV that infiltrated in her left forearm. Pt states she has noticed some swelling and is now having pain in that arm. Is worried she may have a blood clot.

## 2023-01-27 NOTE — ED Provider Notes (Signed)
I informed the patient of her results, she will use warm compresses and anti-inflammatories with a follow-up ultrasound in 2 weeks, she expressed understanding, no signs of DVT, just a superficial thrombophlebitis   Eber Hong, MD 01/27/23 1125

## 2023-02-01 DIAGNOSIS — Z01419 Encounter for gynecological examination (general) (routine) without abnormal findings: Secondary | ICD-10-CM | POA: Diagnosis not present

## 2023-02-01 DIAGNOSIS — Z6830 Body mass index (BMI) 30.0-30.9, adult: Secondary | ICD-10-CM | POA: Diagnosis not present

## 2023-02-01 DIAGNOSIS — N921 Excessive and frequent menstruation with irregular cycle: Secondary | ICD-10-CM | POA: Diagnosis not present

## 2023-02-01 DIAGNOSIS — Z0000 Encounter for general adult medical examination without abnormal findings: Secondary | ICD-10-CM | POA: Diagnosis not present

## 2023-02-08 DIAGNOSIS — N200 Calculus of kidney: Secondary | ICD-10-CM | POA: Diagnosis not present

## 2023-02-18 DIAGNOSIS — N200 Calculus of kidney: Secondary | ICD-10-CM | POA: Diagnosis not present

## 2023-02-21 ENCOUNTER — Ambulatory Visit (HOSPITAL_COMMUNITY)
Admission: RE | Admit: 2023-02-21 | Discharge: 2023-02-21 | Disposition: A | Discharge status: Home / Self Care | Payer: Medicaid Other | Source: Ambulatory Visit | Attending: Obstetrics and Gynecology | Admitting: Obstetrics and Gynecology

## 2023-02-21 ENCOUNTER — Encounter: Payer: Self-pay | Admitting: Family Medicine

## 2023-02-21 DIAGNOSIS — Z1231 Encounter for screening mammogram for malignant neoplasm of breast: Secondary | ICD-10-CM

## 2023-02-21 NOTE — Telephone Encounter (Signed)
Everlene Other G, DO    Please have her make an appt.

## 2023-02-22 ENCOUNTER — Other Ambulatory Visit (HOSPITAL_COMMUNITY): Payer: Self-pay | Admitting: Obstetrics and Gynecology

## 2023-02-22 ENCOUNTER — Ambulatory Visit: Payer: Medicaid Other | Admitting: Family Medicine

## 2023-02-22 ENCOUNTER — Encounter (HOSPITAL_COMMUNITY): Payer: Self-pay | Admitting: Obstetrics and Gynecology

## 2023-02-22 VITALS — BP 132/88 | HR 86 | Temp 98.4°F | Ht 65.0 in | Wt 180.0 lb

## 2023-02-22 DIAGNOSIS — R030 Elevated blood-pressure reading, without diagnosis of hypertension: Secondary | ICD-10-CM

## 2023-02-22 DIAGNOSIS — F411 Generalized anxiety disorder: Secondary | ICD-10-CM | POA: Diagnosis not present

## 2023-02-22 DIAGNOSIS — R809 Proteinuria, unspecified: Secondary | ICD-10-CM | POA: Diagnosis not present

## 2023-02-22 DIAGNOSIS — R928 Other abnormal and inconclusive findings on diagnostic imaging of breast: Secondary | ICD-10-CM

## 2023-02-22 DIAGNOSIS — R7303 Prediabetes: Secondary | ICD-10-CM

## 2023-02-22 DIAGNOSIS — Z862 Personal history of diseases of the blood and blood-forming organs and certain disorders involving the immune mechanism: Secondary | ICD-10-CM | POA: Diagnosis not present

## 2023-02-22 DIAGNOSIS — E781 Pure hyperglyceridemia: Secondary | ICD-10-CM | POA: Diagnosis not present

## 2023-02-22 MED ORDER — ESCITALOPRAM OXALATE 10 MG PO TABS
10.0000 mg | ORAL_TABLET | Freq: Every day | ORAL | 3 refills | Status: DC
Start: 2023-02-22 — End: 2024-01-23

## 2023-02-22 NOTE — Assessment & Plan Note (Signed)
Uncontrolled/worsening. Starting Lexapro.

## 2023-02-22 NOTE — Assessment & Plan Note (Signed)
Labs ordered. Discussed starting pharmacotherapy vs close monitoring. Patient elected for the latter.

## 2023-02-22 NOTE — Patient Instructions (Signed)
Medication as prescribed.  Labs ordered.  Follow up in 6 weeks.

## 2023-02-22 NOTE — Progress Notes (Signed)
Subjective:  Patient ID: Orland Dec, female    DOB: [birth year]  Age: 42 y.o. MRN: [id number]  CC: Chief Complaint  Patient presents with   Anxiety    Feeling overwhelmed and not sleeping well    Hypertension    HPI:  42 year old female presents for evaluation of the above.  Patient reports mild BP elevation. She is concerned about this. BMP 132/88 today.  Patient reports ongoing anxiety. Feels overwhelmed. Easily bothered by small things. Has a history of anxiety. Previously seen therapist and was on medication (Lexapro). She is interested in pharmacotherapy.   Patient Active Problem List   Diagnosis Date Noted   Elevated BP without diagnosis of hypertension 02/22/2023   Renal calculi 01/10/2023   Hypertriglyceridemia 12/28/2022   Proteinuria 12/28/2022   Depression, major, recurrent, moderate (HCC) 12/10/2021   Generalized anxiety disorder 07/22/2020   Cough variant asthma 04/15/2017    Social Hx   Social History   Socioeconomic History   Marital status: Divorced    Spouse name: Not on file   Number of children: 3   Years of education: 14   Highest education level: Some college, no degree  Occupational History   Not on file  Tobacco Use   Smoking status: Never   Smokeless tobacco: Never  Vaping Use   Vaping Use: Never used  Substance and Sexual Activity   Alcohol use: Yes    Alcohol/week: 2.0 standard drinks of alcohol    Types: 2 Cans of beer per week    Comment: occasional   Drug use: No   Sexual activity: Yes    Birth control/protection: Pill  Other Topics Concern   Not on file  Social History Narrative   Not on file   Social Determinants of Health   Financial Resource Strain: Patient Declined (12/26/2022)   Overall Financial Resource Strain (CARDIA)    Difficulty of Paying Living Expenses: Patient declined  Food Insecurity: No Food Insecurity (01/10/2023)   Hunger Vital Sign    Worried About Running Out of Food in the Last Year: Never true     Ran Out of Food in the Last Year: Never true  Transportation Needs: No Transportation Needs (01/10/2023)   PRAPARE - Administrator, Civil Service (Medical): No    Lack of Transportation (Non-Medical): No  Physical Activity: Insufficiently Active (12/26/2022)   Exercise Vital Sign    Days of Exercise per Week: 1 day    Minutes of Exercise per Session: 20 min  Stress: Stress Concern Present (12/26/2022)   [employer] of Occupational Health - Occupational Stress Questionnaire    Feeling of Stress : Rather much  Social Connections: Moderately Integrated (12/26/2022)   Social Connection and Isolation Panel [NHANES]    Frequency of Communication with Friends and Family: Twice a week    Frequency of Social Gatherings with Friends and Family: Once a week    Attends Religious Services: More than 4 times per year    Active Member of [employer] or Organizations: Yes    Attends Engineer, structural: More than 4 times per year    Marital Status: Divorced    Review of Systems Per HPI  Objective:  BP 132/88   Pulse 86   Temp 98.4 F (36.9 C)   Ht 5\' 5"  (1.651 m)   Wt 180 lb (81.6 kg)   LMP 02/11/2023   SpO2 98%   BMI 29.95 kg/m      02/22/2023  4:27 PM 01/27/2023    4:30 AM 01/27/2023    4:26 AM  BP/Weight  Systolic BP 132 144 151  Diastolic BP 88 95 106  Wt. (Lbs) 180  172.18  BMI 29.95 kg/m2  28.65 kg/m2    Physical Exam Constitutional:      General: She is not in acute distress.    Appearance: Normal appearance.  HENT:     Head: Normocephalic and atraumatic.  Cardiovascular:     Rate and Rhythm: Normal rate and regular rhythm.  Pulmonary:     Effort: Pulmonary effort is normal.     Breath sounds: Normal breath sounds. No wheezing or rales.  Neurological:     Mental Status: She is alert.  Psychiatric:     Comments: Anxious.      Lab Results  Component Value Date   WBC 13.4 (H) 01/13/2023   HGB 11.4 (L) 01/13/2023   HCT 35.3 (L) 01/13/2023    PLT 266 01/13/2023   GLUCOSE 112 (H) 01/13/2023   CHOL 236 (H) 12/14/2021   TRIG 201 (H) 12/14/2021   HDL 55 12/14/2021   LDLCALC 145 (H) 12/14/2021   ALT 26 12/14/2021   AST 28 12/14/2021   NA 137 01/13/2023   K 3.6 01/13/2023   CL 104 01/13/2023   CREATININE 0.89 01/13/2023   BUN 12 01/13/2023   CO2 25 01/13/2023   TSH 1.830 12/14/2021   INR 0.9 01/10/2023   HGBA1C 5.8 (H) 12/14/2021     Assessment & Plan:   Problem List Items Addressed This Visit       Other   Proteinuria   Relevant Orders   Microalbumin / creatinine urine ratio   Hypertriglyceridemia   Relevant Orders   Lipid panel   Generalized anxiety disorder - Primary    Uncontrolled/worsening. Starting Lexapro.       Relevant Medications   escitalopram (LEXAPRO) 10 MG tablet   Elevated BP without diagnosis of hypertension    Labs ordered. Discussed starting pharmacotherapy vs close monitoring. Patient elected for the latter.       Relevant Orders   CMP14+EGFR   Other Visit Diagnoses     History of anemia       Relevant Orders   CBC   Prediabetes       Relevant Orders   Hemoglobin A1c       Meds ordered this encounter  Medications   escitalopram (LEXAPRO) 10 MG tablet    Sig: Take 1 tablet (10 mg total) by mouth daily.    Dispense:  90 tablet    Refill:  3    Follow-up:  Return in about 6 weeks (around 04/05/2023).  Everlene Other DO [facility]

## 2023-02-23 DIAGNOSIS — E781 Pure hyperglyceridemia: Secondary | ICD-10-CM | POA: Diagnosis not present

## 2023-02-23 DIAGNOSIS — Z862 Personal history of diseases of the blood and blood-forming organs and certain disorders involving the immune mechanism: Secondary | ICD-10-CM | POA: Diagnosis not present

## 2023-02-23 DIAGNOSIS — R7303 Prediabetes: Secondary | ICD-10-CM | POA: Diagnosis not present

## 2023-02-23 DIAGNOSIS — R809 Proteinuria, unspecified: Secondary | ICD-10-CM | POA: Diagnosis not present

## 2023-02-23 DIAGNOSIS — R030 Elevated blood-pressure reading, without diagnosis of hypertension: Secondary | ICD-10-CM | POA: Diagnosis not present

## 2023-02-24 ENCOUNTER — Other Ambulatory Visit (HOSPITAL_COMMUNITY): Payer: Self-pay | Admitting: Obstetrics and Gynecology

## 2023-02-24 ENCOUNTER — Ambulatory Visit (HOSPITAL_COMMUNITY)
Admission: RE | Admit: 2023-02-24 | Discharge: 2023-02-24 | Disposition: A | Discharge status: Home / Self Care | Payer: Medicaid Other | Source: Ambulatory Visit | Attending: Obstetrics and Gynecology | Admitting: Obstetrics and Gynecology

## 2023-02-24 ENCOUNTER — Encounter (HOSPITAL_COMMUNITY): Payer: Self-pay

## 2023-02-24 DIAGNOSIS — R928 Other abnormal and inconclusive findings on diagnostic imaging of breast: Secondary | ICD-10-CM | POA: Insufficient documentation

## 2023-02-24 DIAGNOSIS — N6313 Unspecified lump in the right breast, lower outer quadrant: Secondary | ICD-10-CM | POA: Diagnosis not present

## 2023-02-24 DIAGNOSIS — N6314 Unspecified lump in the right breast, lower inner quadrant: Secondary | ICD-10-CM | POA: Diagnosis not present

## 2023-02-24 DIAGNOSIS — R92333 Mammographic heterogeneous density, bilateral breasts: Secondary | ICD-10-CM | POA: Diagnosis not present

## 2023-02-24 DIAGNOSIS — N6002 Solitary cyst of left breast: Secondary | ICD-10-CM | POA: Diagnosis not present

## 2023-02-24 LAB — CMP14+EGFR
ALT: 17 IU/L (ref 0–32)
AST: 16 IU/L (ref 0–40)
Albumin/Globulin Ratio: 1.5 (ref 1.2–2.2)
Albumin: 4.1 g/dL (ref 3.9–4.9)
Alkaline Phosphatase: 79 IU/L (ref 44–121)
BUN/Creatinine Ratio: 11 (ref 9–23)
BUN: 12 mg/dL (ref 6–24)
Bilirubin Total: 0.5 mg/dL (ref 0.0–1.2)
CO2: 21 mmol/L (ref 20–29)
Calcium: 9.2 mg/dL (ref 8.7–10.2)
Chloride: 102 mmol/L (ref 96–106)
Creatinine, Ser: 1.13 mg/dL — ABNORMAL HIGH (ref 0.57–1.00)
Globulin, Total: 2.7 g/dL (ref 1.5–4.5)
Glucose: 115 mg/dL — ABNORMAL HIGH (ref 70–99)
Potassium: 4.2 mmol/L (ref 3.5–5.2)
Sodium: 139 mmol/L (ref 134–144)
Total Protein: 6.8 g/dL (ref 6.0–8.5)
eGFR: 62 mL/min/{1.73_m2} (ref >59)

## 2023-02-24 LAB — CBC
Hematocrit: 37.2 % (ref 34.0–46.6)
Hemoglobin: 11.9 g/dL (ref 11.1–15.9)
MCH: 28.1 pg (ref 26.6–33.0)
MCHC: 32 g/dL (ref 31.5–35.7)
MCV: 88 fL (ref 79–97)
Platelets: 307 10*3/uL (ref 150–450)
RBC: 4.24 x10E6/uL (ref 3.77–5.28)
RDW: 12.8 % (ref 11.7–15.4)
WBC: 6.8 10*3/uL (ref 3.4–10.8)

## 2023-02-24 LAB — LIPID PANEL
Chol/HDL Ratio: 3.7 ratio (ref 0.0–4.4)
Cholesterol, Total: 214 mg/dL — ABNORMAL HIGH (ref 100–199)
HDL: 58 mg/dL (ref >39)
LDL Chol Calc (NIH): 111 mg/dL — ABNORMAL HIGH (ref 0–99)
Triglycerides: 261 mg/dL — ABNORMAL HIGH (ref 0–149)
VLDL Cholesterol Cal: 45 mg/dL — ABNORMAL HIGH (ref 5–40)

## 2023-02-24 LAB — HEMOGLOBIN A1C
Est. average glucose Bld gHb Est-mCnc: 126 mg/dL
Hgb A1c MFr Bld: 6 % — ABNORMAL HIGH (ref 4.8–5.6)

## 2023-02-24 LAB — MICROALBUMIN / CREATININE URINE RATIO
Creatinine, Urine: 13.8 mg/dL
Microalb/Creat Ratio: 236 mg/g creat — ABNORMAL HIGH (ref 0–29)
Microalbumin, Urine: 32.5 ug/mL

## 2023-03-01 ENCOUNTER — Encounter: Payer: Self-pay | Admitting: Family Medicine

## 2023-03-03 ENCOUNTER — Other Ambulatory Visit: Payer: Self-pay | Admitting: Nurse Practitioner

## 2023-03-03 MED ORDER — VALSARTAN 40 MG PO TABS: 40.0000 mg | ORAL_TABLET | Freq: Every day | ORAL | 0 refills | Status: DC

## 2023-03-07 ENCOUNTER — Other Ambulatory Visit (HOSPITAL_COMMUNITY): Payer: Self-pay | Admitting: Obstetrics and Gynecology

## 2023-03-07 DIAGNOSIS — R928 Other abnormal and inconclusive findings on diagnostic imaging of breast: Secondary | ICD-10-CM

## 2023-03-08 ENCOUNTER — Ambulatory Visit (HOSPITAL_COMMUNITY)
Admission: RE | Admit: 2023-03-08 | Discharge: 2023-03-08 | Disposition: A | Discharge status: Home / Self Care | Payer: Medicaid Other | Source: Ambulatory Visit | Attending: Obstetrics and Gynecology | Admitting: Obstetrics and Gynecology

## 2023-03-08 ENCOUNTER — Encounter (HOSPITAL_COMMUNITY): Payer: Self-pay

## 2023-03-08 ENCOUNTER — Other Ambulatory Visit (HOSPITAL_COMMUNITY): Payer: Self-pay | Admitting: Obstetrics and Gynecology

## 2023-03-08 DIAGNOSIS — N6323 Unspecified lump in the left breast, lower outer quadrant: Secondary | ICD-10-CM | POA: Insufficient documentation

## 2023-03-08 DIAGNOSIS — N6032 Fibrosclerosis of left breast: Secondary | ICD-10-CM | POA: Diagnosis not present

## 2023-03-08 DIAGNOSIS — R928 Other abnormal and inconclusive findings on diagnostic imaging of breast: Secondary | ICD-10-CM

## 2023-03-08 DIAGNOSIS — N6002 Solitary cyst of left breast: Secondary | ICD-10-CM | POA: Diagnosis not present

## 2023-03-08 DIAGNOSIS — N6322 Unspecified lump in the left breast, upper inner quadrant: Secondary | ICD-10-CM | POA: Diagnosis not present

## 2023-03-08 DIAGNOSIS — N6082 Other benign mammary dysplasias of left breast: Secondary | ICD-10-CM | POA: Diagnosis not present

## 2023-03-08 DIAGNOSIS — N6001 Solitary cyst of right breast: Secondary | ICD-10-CM | POA: Diagnosis not present

## 2023-03-08 DIAGNOSIS — N6012 Diffuse cystic mastopathy of left breast: Secondary | ICD-10-CM | POA: Diagnosis not present

## 2023-03-08 HISTORY — PX: BREAST BIOPSY: SHX20

## 2023-03-08 MED ORDER — LIDOCAINE-EPINEPHRINE (PF) 1 %-1:200000 IJ SOLN
INTRAMUSCULAR | Status: AC
  Filled 2023-03-08: qty 30

## 2023-03-08 MED ORDER — LIDOCAINE HCL (PF) 2 % IJ SOLN
INTRAMUSCULAR | Status: AC
  Filled 2023-03-08: qty 20

## 2023-03-08 MED ORDER — LIDOCAINE HCL (PF) 2 % IJ SOLN
20.0000 mL | Freq: Once | INTRAMUSCULAR | Status: AC
  Administered 2023-03-08: 20 mL

## 2023-03-08 MED ORDER — LIDOCAINE-EPINEPHRINE (PF) 1 %-1:200000 IJ SOLN
20.0000 mL | Freq: Once | INTRAMUSCULAR | Status: AC
  Administered 2023-03-08: 20 mL via INTRADERMAL

## 2023-03-08 NOTE — Progress Notes (Signed)
PT tolerated left breast biopsy well today with NAD noted. PT verbalized understanding of discharge instructions. PT ambulated back to the mammogram area this time and given ice packs. 

## 2023-03-09 LAB — SURGICAL PATHOLOGY

## 2023-03-14 ENCOUNTER — Other Ambulatory Visit: Payer: Self-pay | Admitting: Nurse Practitioner

## 2023-03-14 DIAGNOSIS — R809 Proteinuria, unspecified: Secondary | ICD-10-CM

## 2023-03-14 DIAGNOSIS — E781 Pure hyperglyceridemia: Secondary | ICD-10-CM

## 2023-03-14 DIAGNOSIS — Z79899 Other long term (current) drug therapy: Secondary | ICD-10-CM

## 2023-04-05 ENCOUNTER — Ambulatory Visit: Payer: Medicaid Other | Admitting: Family Medicine

## 2023-04-05 VITALS — BP 132/84 | HR 76 | Temp 98.2°F | Ht 65.0 in | Wt 173.0 lb

## 2023-04-05 DIAGNOSIS — I10 Essential (primary) hypertension: Secondary | ICD-10-CM

## 2023-04-05 DIAGNOSIS — E782 Mixed hyperlipidemia: Secondary | ICD-10-CM | POA: Insufficient documentation

## 2023-04-05 DIAGNOSIS — F411 Generalized anxiety disorder: Secondary | ICD-10-CM

## 2023-04-05 NOTE — Assessment & Plan Note (Signed)
BP fairly well-controlled here today.  Patient is going to work on diet and lifestyle.  If blood pressure increases or remains persistently elevated greater than 130/80 we will increase the valsartan.

## 2023-04-05 NOTE — Progress Notes (Signed)
Subjective:  Patient ID: Lauren [name], female    DOB: 11-Jun-1981  Age: 42 y.o. MRN: [id number]  CC: Chief Complaint  Patient presents with   anxiety follow up    Hypertension    HPI:  42 year old female presents for follow-up.  Patient reports that her anxiety has improved on Lexapro.  Patient reports that she has had home BP readings that have been elevated.  BP 132/84 here today.  She is on low-dose valsartan.  Patient is working on diet and lifestyle.  She has an appointment with a nutritionist.  Additionally, patient has had proteinuria.  This needs to be assessed at follow-up.  Hopefully ARB is helping.  Patient Active Problem List   Diagnosis Date Noted   Mixed hyperlipidemia 04/05/2023   Essential hypertension 04/05/2023   Renal calculi 01/10/2023   Proteinuria 12/28/2022   Depression, major, recurrent, moderate (HCC) 12/10/2021   Generalized anxiety disorder 07/22/2020   Cough variant asthma 04/15/2017    Social Hx   Social History   Socioeconomic History   Marital status: Divorced    Spouse name: Not on file   Number of children: 3   Years of education: 14   Highest education level: Some college, no degree  Occupational History   Not on file  Tobacco Use   Smoking status: Never   Smokeless tobacco: Never  Vaping Use   Vaping status: Never Used  Substance and Sexual Activity   Alcohol use: Yes    Alcohol/week: 2.0 standard drinks of alcohol    Types: 2 Cans of beer per week    Comment: occasional   Drug use: No   Sexual activity: Yes    Birth control/protection: Pill  Other Topics Concern   Not on file  Social History Narrative   Not on file   Social Determinants of Health   Financial Resource Strain: Patient Declined (12/26/2022)   Overall Financial Resource Strain (CARDIA)    Difficulty of Paying Living Expenses: Patient declined  Food Insecurity: No Food Insecurity (01/10/2023)   Hunger Vital Sign    Worried About Running Out of Food in  the Last Year: Never true    Ran Out of Food in the Last Year: Never true  Transportation Needs: No Transportation Needs (01/10/2023)   PRAPARE - Administrator, Civil Service (Medical): No    Lack of Transportation (Non-Medical): No  Physical Activity: Insufficiently Active (12/26/2022)   Exercise Vital Sign    Days of Exercise per Week: 1 day    Minutes of Exercise per Session: 20 min  Stress: Stress Concern Present (12/26/2022)   [employer] of Occupational Health - Occupational Stress Questionnaire    Feeling of Stress : Rather much  Social Connections: Moderately Integrated (12/26/2022)   Social Connection and Isolation Panel [NHANES]    Frequency of Communication with Friends and Family: Twice a week    Frequency of Social Gatherings with Friends and Family: Once a week    Attends Religious Services: More than 4 times per year    Active Member of [employer] or Organizations: Yes    Attends Engineer, structural: More than 4 times per year    Marital Status: Divorced    Review of Systems  Constitutional: Negative.    Objective:  BP 132/84   Pulse 76   Temp 98.2 F (36.8 C)   Ht 5\' 5"  (1.651 m)   Wt 173 lb (78.5 kg)   SpO2 96%   BMI 28.79  kg/m      04/05/2023    3:46 PM 03/08/2023    8:15 AM 02/22/2023    4:27 PM  BP/Weight  Systolic BP 132 156 132  Diastolic BP 84 91 88  Wt. (Lbs) 173  180  BMI 28.79 kg/m2  29.95 kg/m2    Physical Exam Vitals and nursing note reviewed.  Constitutional:      General: She is not in acute distress.    Appearance: Normal appearance.  HENT:     Head: Normocephalic and atraumatic.  Eyes:     General:        Right eye: No discharge.        Left eye: No discharge.     Conjunctiva/sclera: Conjunctivae normal.  Cardiovascular:     Rate and Rhythm: Normal rate and regular rhythm.  Pulmonary:     Effort: Pulmonary effort is normal.     Breath sounds: Normal breath sounds. No wheezing, rhonchi or rales.   Neurological:     Mental Status: She is alert.  Psychiatric:        Mood and Affect: Mood normal.        Behavior: Behavior normal.     Lab Results  Component Value Date   WBC 6.8 02/23/2023   HGB 11.9 02/23/2023   HCT 37.2 02/23/2023   PLT 307 02/23/2023   GLUCOSE 115 (H) 02/23/2023   CHOL 214 (H) 02/23/2023   TRIG 261 (H) 02/23/2023   HDL 58 02/23/2023   LDLCALC 111 (H) 02/23/2023   ALT 17 02/23/2023   AST 16 02/23/2023   NA 139 02/23/2023   K 4.2 02/23/2023   CL 102 02/23/2023   CREATININE 1.13 (H) 02/23/2023   BUN 12 02/23/2023   CO2 21 02/23/2023   TSH 1.830 12/14/2021   INR 0.9 01/10/2023   HGBA1C 6.0 (H) 02/23/2023     Assessment & Plan:   Problem List Items Addressed This Visit       Cardiovascular and Mediastinum   Essential hypertension - Primary    BP fairly well-controlled here today.  Patient is going to work on diet and lifestyle.  If blood pressure increases or remains persistently elevated greater than 130/80 we will increase the valsartan.        Other   Generalized anxiety disorder    Improved on Lexapro.  Continue.      Follow-up:  Return in about 3 months (around 07/06/2023) for HTN follow up.  Everlene Other DO [facility]

## 2023-04-05 NOTE — Assessment & Plan Note (Signed)
Improved on Lexapro.  Continue.

## 2023-04-05 NOTE — Patient Instructions (Signed)
Continue your medications.  Follow up in 3 months.  

## 2023-04-28 ENCOUNTER — Ambulatory Visit: Payer: Medicaid Other | Admitting: Nutrition

## 2023-06-06 ENCOUNTER — Other Ambulatory Visit: Payer: Self-pay | Admitting: *Deleted

## 2023-06-06 MED ORDER — VALSARTAN 40 MG PO TABS: 40.0000 mg | ORAL_TABLET | Freq: Every day | ORAL | 0 refills | Status: DC

## 2023-06-23 DIAGNOSIS — N182 Chronic kidney disease, stage 2 (mild): Secondary | ICD-10-CM | POA: Diagnosis not present

## 2023-06-23 DIAGNOSIS — E559 Vitamin D deficiency, unspecified: Secondary | ICD-10-CM | POA: Diagnosis not present

## 2023-06-23 DIAGNOSIS — I10 Essential (primary) hypertension: Secondary | ICD-10-CM | POA: Diagnosis not present

## 2023-06-23 DIAGNOSIS — N200 Calculus of kidney: Secondary | ICD-10-CM | POA: Diagnosis not present

## 2023-06-27 LAB — LAB REPORT - SCANNED
Creatinine, POC: 17.3 mg/dL
EGFR: 62

## 2023-06-28 ENCOUNTER — Encounter: Payer: Self-pay | Admitting: Family Medicine

## 2023-06-28 DIAGNOSIS — E669 Obesity, unspecified: Secondary | ICD-10-CM

## 2023-06-29 NOTE — Telephone Encounter (Signed)
Tommie Sams, DO     Please place referral to Dietician.

## 2023-07-19 DIAGNOSIS — N200 Calculus of kidney: Secondary | ICD-10-CM | POA: Diagnosis not present

## 2023-08-04 ENCOUNTER — Other Ambulatory Visit: Payer: Self-pay | Admitting: Nurse Practitioner

## 2023-08-04 MED ORDER — VALSARTAN 80 MG PO TABS: 80.0000 mg | ORAL_TABLET | Freq: Every day | ORAL | 0 refills | Status: AC

## 2023-08-04 MED ORDER — HYDROCHLOROTHIAZIDE 25 MG PO TABS: 25.0000 mg | ORAL_TABLET | Freq: Every day | ORAL | 0 refills | Status: DC

## 2023-08-23 ENCOUNTER — Emergency Department (HOSPITAL_COMMUNITY)
Admission: EM | Admit: 2023-08-23 | Discharge: 2023-08-23 | Disposition: A | Discharge status: Home / Self Care | Payer: Medicaid Other | Attending: Student | Admitting: Student

## 2023-08-23 ENCOUNTER — Emergency Department (HOSPITAL_COMMUNITY): Payer: Medicaid Other

## 2023-08-23 ENCOUNTER — Other Ambulatory Visit (HOSPITAL_COMMUNITY): Payer: Self-pay

## 2023-08-23 ENCOUNTER — Encounter (HOSPITAL_COMMUNITY): Payer: Self-pay | Admitting: Emergency Medicine

## 2023-08-23 ENCOUNTER — Ambulatory Visit: Payer: Self-pay | Admitting: Family Medicine

## 2023-08-23 ENCOUNTER — Other Ambulatory Visit: Payer: Self-pay

## 2023-08-23 DIAGNOSIS — Z7901 Long term (current) use of anticoagulants: Secondary | ICD-10-CM | POA: Diagnosis not present

## 2023-08-23 DIAGNOSIS — M79661 Pain in right lower leg: Secondary | ICD-10-CM | POA: Diagnosis present

## 2023-08-23 DIAGNOSIS — M7989 Other specified soft tissue disorders: Secondary | ICD-10-CM | POA: Diagnosis not present

## 2023-08-23 DIAGNOSIS — I82811 Embolism and thrombosis of superficial veins of right lower extremities: Secondary | ICD-10-CM | POA: Diagnosis not present

## 2023-08-23 DIAGNOSIS — M79604 Pain in right leg: Secondary | ICD-10-CM | POA: Diagnosis not present

## 2023-08-23 DIAGNOSIS — M25521 Pain in right elbow: Secondary | ICD-10-CM | POA: Diagnosis not present

## 2023-08-23 DIAGNOSIS — I10 Essential (primary) hypertension: Secondary | ICD-10-CM | POA: Insufficient documentation

## 2023-08-23 DIAGNOSIS — I8001 Phlebitis and thrombophlebitis of superficial vessels of right lower extremity: Secondary | ICD-10-CM | POA: Diagnosis not present

## 2023-08-23 DIAGNOSIS — M79601 Pain in right arm: Secondary | ICD-10-CM | POA: Diagnosis not present

## 2023-08-23 DIAGNOSIS — R600 Localized edema: Secondary | ICD-10-CM | POA: Diagnosis not present

## 2023-08-23 LAB — CBC WITH DIFFERENTIAL/PLATELET
Abs Immature Granulocytes: 0.01 10*3/uL (ref 0.00–0.07)
Basophils Absolute: 0 10*3/uL (ref 0.0–0.1)
Basophils Relative: 1 %
Eosinophils Absolute: 0.2 10*3/uL (ref 0.0–0.5)
Eosinophils Relative: 3 %
HCT: 37.1 % (ref 36.0–46.0)
Hemoglobin: 11.8 g/dL — ABNORMAL LOW (ref 12.0–15.0)
Immature Granulocytes: 0 %
Lymphocytes Relative: 24 %
Lymphs Abs: 1.7 10*3/uL (ref 0.7–4.0)
MCH: 27.4 pg (ref 26.0–34.0)
MCHC: 31.8 g/dL (ref 30.0–36.0)
MCV: 86.3 fL (ref 80.0–100.0)
Monocytes Absolute: 0.3 10*3/uL (ref 0.1–1.0)
Monocytes Relative: 4 %
Neutro Abs: 4.9 10*3/uL (ref 1.7–7.7)
Neutrophils Relative %: 68 %
Platelets: 292 10*3/uL (ref 150–400)
RBC: 4.3 MIL/uL (ref 3.87–5.11)
RDW: 13.8 % (ref 11.5–15.5)
WBC: 7.1 10*3/uL (ref 4.0–10.5)
nRBC: 0 % (ref 0.0–0.2)

## 2023-08-23 LAB — URINALYSIS, ROUTINE W REFLEX MICROSCOPIC
Bilirubin Urine: NEGATIVE
Glucose, UA: NEGATIVE mg/dL
Ketones, ur: NEGATIVE mg/dL
Nitrite: NEGATIVE
Protein, ur: 30 mg/dL — AB
Specific Gravity, Urine: 1.009 (ref 1.005–1.030)
pH: 7 (ref 5.0–8.0)

## 2023-08-23 LAB — COMPREHENSIVE METABOLIC PANEL
ALT: 15 U/L (ref 0–44)
AST: 16 U/L (ref 15–41)
Albumin: 3.3 g/dL — ABNORMAL LOW (ref 3.5–5.0)
Alkaline Phosphatase: 57 U/L (ref 38–126)
Anion gap: 10 (ref 5–15)
BUN: 14 mg/dL (ref 6–20)
CO2: 25 mmol/L (ref 22–32)
Calcium: 9.2 mg/dL (ref 8.9–10.3)
Chloride: 104 mmol/L (ref 98–111)
Creatinine, Ser: 0.93 mg/dL (ref 0.44–1.00)
GFR, Estimated: >60 mL/min (ref >60)
Glucose, Bld: 155 mg/dL — ABNORMAL HIGH (ref 70–99)
Potassium: 3.9 mmol/L (ref 3.5–5.1)
Sodium: 139 mmol/L (ref 135–145)
Total Bilirubin: 0.5 mg/dL (ref <1.2)
Total Protein: 6.8 g/dL (ref 6.5–8.1)

## 2023-08-23 LAB — CK: Total CK: 65 U/L (ref 38–234)

## 2023-08-23 LAB — PREGNANCY, URINE: Preg Test, Ur: NEGATIVE

## 2023-08-23 LAB — MAGNESIUM: Magnesium: 1.6 mg/dL — ABNORMAL LOW (ref 1.7–2.4)

## 2023-08-23 MED ORDER — APIXABAN (ELIQUIS) EDUCATION KIT FOR DVT/PE PATIENTS: PACK | Freq: Once | Status: DC

## 2023-08-23 MED ORDER — APIXABAN (ELIQUIS) VTE STARTER PACK (10MG AND 5MG): ORAL_TABLET | ORAL | 0 refills | Status: DC

## 2023-08-23 MED ORDER — APIXABAN 5 MG PO TABS
10.0000 mg | ORAL_TABLET | Freq: Once | ORAL | Status: AC
  Administered 2023-08-23: 10 mg via ORAL
  Filled 2023-08-23: qty 2

## 2023-08-23 MED ORDER — MAGNESIUM OXIDE -MG SUPPLEMENT 400 (240 MG) MG PO TABS
400.0000 mg | ORAL_TABLET | Freq: Every day | ORAL | Status: DC
  Administered 2023-08-23: 400 mg via ORAL
  Filled 2023-08-23: qty 1

## 2023-08-23 NOTE — ED Provider Notes (Signed)
[place] EMERGENCY DEPARTMENT AT [facility] Provider Note   CSN: [id number] Arrival date & time: 08/23/23  4098     History Chief Complaint  Patient presents with   Arm Pain   Leg Pain    Lauren [name] is a 42 y.o. female with h/o anxiety, HTN, and kidney stones presents to the ER for evaluation of right lower leg pain and swelling for the past week. She also reports that she has been having some pain in her right lower arm with some swelling. She reports it is pain upon palpation and with rotation and lifting.  She denies any recent injections or blood draws in the area.  Denies any IV drug use ever.  For her leg, she denies any trauma to the area.  She denies any chest pain or shortness of breath.  She is on OCPs.  Denies any recent surgeries or long travel.  Denies any history of DVT/PE or history of cancer.  Denies any hemoptysis.  She denies any coagulopathy disorders such as factor C deficiency, factor V Leiden, etc.   Arm Pain Pertinent negatives include no chest pain, no abdominal pain and no shortness of breath.  Leg Pain Associated symptoms: no fever        Home Medications Prior to Admission medications   Medication Sig Start Date End Date Taking? Authorizing Provider  APIXABAN (ELIQUIS) VTE STARTER PACK (10MG  AND 5MG ) Take as directed on package: start with two-5mg  tablets twice daily for 7 days. On day 8, switch to one-5mg  tablet twice daily. 08/23/23  Yes [name], PA-C  escitalopram (LEXAPRO) 10 MG tablet Take 1 tablet (10 mg total) by mouth daily. 02/22/23   Tommie Sams, DO  hydrochlorothiazide (HYDRODIURIL) 25 MG tablet Take 1 tablet (25 mg total) by mouth daily. Prn swelling 08/04/23   Campbell Riches, NP  JUNEL FE 1.5/30 1.5-30 MG-MCG tablet Take 1 tablet by mouth daily. 10/24/21   [provider]  omeprazole (PRILOSEC) 20 MG capsule Take 20 mg by mouth daily as needed (indigestion).    [provider]  Potassium Citrate 15  MEQ (1620 MG) TBCR Take 1 tablet by mouth 2 (two) times daily. 03/07/23   [provider]  valsartan (DIOVAN) 80 MG tablet Take 1 tablet (80 mg total) by mouth daily. 08/04/23   Campbell Riches, NP      Allergies    Patient has no known allergies.    Review of Systems   Review of Systems  Constitutional:  Negative for chills and fever.  Respiratory:  Negative for shortness of breath.   Cardiovascular:  Positive for leg swelling. Negative for chest pain and palpitations.  Gastrointestinal:  Negative for abdominal pain, nausea and vomiting.  Musculoskeletal:  Positive for myalgias.    Physical Exam Updated Vital Signs BP (!) 163/91   Pulse 73   Temp 98.5 F (36.9 C) (Oral)   Resp 18   SpO2 100%  Physical Exam Vitals and nursing note reviewed.  Constitutional:      Appearance: She is not toxic-appearing.     Comments: Patient is anxious appearing but not toxic  HENT:     Mouth/Throat:     Mouth: Mucous membranes are moist.  Eyes:     General: No scleral icterus. Cardiovascular:     Rate and Rhythm: Normal rate.     Pulses:          Radial pulses are 2+ on the right side and 2+  on the left side.       Dorsalis pedis pulses are 2+ on the right side and 2+ on the left side.       Posterior tibial pulses are 2+ on the right side and 2+ on the left side.  Pulmonary:     Effort: Pulmonary effort is normal. No respiratory distress.  Musculoskeletal:     Right lower leg: No edema.     Left lower leg: No edema.       Legs:     Comments: Pain and some minimal swelling noted to the more medial aspect of the right lower extremity.  Please see marked area.  There is no crepitus or any induration or fluctuance.  Some mildly increase in warmth and erythema but no red linear streaking.  She is palpable DP and PT pulses with brisk cap refill present on all toes.  Compartments are soft throughout.  Temperature feel symmetric in the lower legs.  For the patient's right upper arm  tenderness, it is overlying the brachial radialis muscle.  It is minimally swollen if any.  There is no overlying erythema or warmth.  She has palpable and symmetric radial pulses.  Brisk cap refill present all 5 fingers.  No tenderness to the medial or lateral epicondyle. Strength is symmetric with and without resistance of the upper and lower extremities bilaterally.  Sensation is reportedly intact and symmetric throughout.  There is no tenderness or other swelling present to other upper or lower extremity.  No swelling over joint.  Skin:    General: Skin is warm and dry.  Neurological:     Mental Status: She is alert.     ED Results / Procedures / Treatments   Labs (all labs ordered are listed, but only abnormal results are displayed) Labs Reviewed  CBC WITH DIFFERENTIAL/PLATELET - Abnormal; Notable for the following components:      Result Value   Hemoglobin 11.8 (*)    All other components within normal limits  COMPREHENSIVE METABOLIC PANEL - Abnormal; Notable for the following components:   Glucose, Bld 155 (*)    Albumin 3.3 (*)    All other components within normal limits  MAGNESIUM - Abnormal; Notable for the following components:   Magnesium 1.6 (*)    All other components within normal limits  URINALYSIS, ROUTINE W REFLEX MICROSCOPIC - Abnormal; Notable for the following components:   Hgb urine dipstick MODERATE (*)    Protein, ur 30 (*)    Leukocytes,Ua TRACE (*)    Bacteria, UA RARE (*)    All other components within normal limits  URINE CULTURE  CK  PREGNANCY, URINE    EKG None  Radiology US Venous Img Lower Unilateral Right  Result Date: 08/23/2023 CLINICAL DATA:  Right lower extremity pain and edema. Evaluate for DVT. EXAM: RIGHT LOWER EXTREMITY VENOUS DOPPLER ULTRASOUND TECHNIQUE: Gray-scale sonography with graded compression, as well as color Doppler and duplex ultrasound were performed to evaluate the lower extremity deep venous systems from the level of the  common femoral vein and including the common femoral, femoral, profunda femoral, popliteal and calf veins including the posterior tibial, peroneal and gastrocnemius veins when visible. The superficial great saphenous vein was also interrogated. Spectral Doppler was utilized to evaluate flow at rest and with distal augmentation maneuvers in the common femoral, femoral and popliteal veins. COMPARISON:  None Available. FINDINGS: Contralateral Common Femoral Vein: Respiratory phasicity is normal and symmetric with the symptomatic side. No evidence of thrombus. Normal  compressibility. Common Femoral Vein: No evidence of thrombus. Normal compressibility, respiratory phasicity and response to augmentation. Saphenofemoral Junction: No evidence of thrombus. Normal compressibility and flow on color Doppler imaging. Profunda Femoral Vein: No evidence of thrombus. Normal compressibility and flow on color Doppler imaging. Femoral Vein: No evidence of thrombus. Normal compressibility, respiratory phasicity and response to augmentation. Popliteal Vein: No evidence of thrombus. Normal compressibility, respiratory phasicity and response to augmentation. Calf Veins: No evidence of thrombus. Normal compressibility and flow on color Doppler imaging. Superficial Great Saphenous Vein: There is hypoechoic occlusive thrombus within the greater saphenous vein at the level of the mid thigh, extending to the calf (images 31 through 33). Other Findings:  None. IMPRESSION: 1. No evidence of DVT within right lower extremity. 2. The examination is positive for occlusive superficial thrombophlebitis involving the greater saphenous vein extending from the mid thigh through the calf. Electronically Signed   By: Simonne Come [name]   On: 08/23/2023 12:46   DG Elbow Complete Right  Result Date: 08/23/2023 CLINICAL DATA:  Right arm pain and swelling. EXAM: RIGHT ELBOW - COMPLETE 3+ VIEW COMPARISON:  None Available. FINDINGS: There is no evidence of  fracture, dislocation, or joint effusion. Joint spaces are preserved. Soft tissues are unremarkable. IMPRESSION: No acute osseous abnormality. Electronically Signed   By: Hart Robinsons [name]   On: 08/23/2023 11:09    Procedures Procedures   Medications Ordered in ED Medications  magnesium oxide (MAG-OX) tablet 400 mg (400 mg Oral Given 08/23/23 1313)  apixaban (ELIQUIS) Education Kit for DVT/PE patients (has no administration in time range)  apixaban (ELIQUIS) tablet 10 mg (has no administration in time range)    ED Course/ Medical Decision Making/ A&P    Medical Decision Making Amount and/or Complexity of Data Reviewed Labs: ordered. Radiology: ordered.  Risk OTC drugs. Prescription drug management.   42 y.o. female presents to the ER for evaluation of arm and leg pain. Differential diagnosis includes but is not limited to MSK, DVT, phlebitis. Vital signs mildly elevated BP, otherwise unremarkable. Physical exam as noted above.   I independently reviewed and interpreted the patient's labs. CK within normal limits. Magnesium mildly low at 1.6. CMP shows glucose 155 and albumin at 3.3, otherwise unremarkable. CBC shows mild anemia at 11.8, no leukocytosis. Pregnancy negative. Urinalysis shows moderate amount of hemoglobin, 30 protein, trace leukocytes, and rare bacteria. Patient is not having any abdominal pain or urinary symptoms will culture.   XR of the right arm shows No acute osseous abnormality. Per radiologist's interpretation.    US shows 1. No evidence of DVT within right lower extremity. 2. The examination is positive for occlusive superficial thrombophlebitis involving the greater saphenous vein extending from the mid thigh through the calf. Per radiologist's interpretation.    Given the extent of the patient's superficial VT, unsure if this patient needs ASA or to be placed on anti-coagulants. I consulted vascular surgery and spoke with Dr. Randie Heinz. He recommends 45 days of  Eliquis with the initial loading week dose and follow up outpatient.   I discussed with the patient the risk of Eliquis. She was given additional information from pharmacy as well. She verbalizes her understanding. She was given her first dose here. We discussed no ibuprofen and returning to the ER if injured.   For her arm pain, I think this is likely MSK given that her tenderness is over the brachioradialias muscle. She is NV intact distally. No skin changes. No joint swelling. Hurts with movement  and palpation. She has not had recent blood draws, injections, etc. near the area. I do not think there is a blood clot in the area. Recommended Tylenol for pain.  She does not have any chest pain or SOB. She is not tachycardic or hypoxic. I do not see a need for CT scan for PE at this time.     I have discussed the importance of following up with her PCP for this as they will need to monitor. She reports that she has an appointment with them later this week. Asked that she follow up with them about her complaints and findings today. We discussed the importance of medication compliance and follow up with vascular surgery.   We discussed the results of the labs/imaging. The plan is take Eliquis, follow up with PCP/vascular surgery. We discussed strict return precautions and red flag symptoms. The patient verbalized their understanding and agrees to the plan. The patient is stable and being discharged home in good condition.  Portions of this report may have been transcribed using voice recognition software. Every effort was made to ensure accuracy; however, inadvertent computerized transcription errors may be present.   I discussed this case with my attending physician who cosigned this note including patient's presenting symptoms, physical exam, and planned diagnostics and interventions. Attending physician stated agreement with plan or made changes to plan which were implemented.   Final Clinical  Impression(s) / ED Diagnoses Final diagnoses:  Superficial thrombosis of lower extremity, right  Right arm pain    Rx / DC Orders ED Discharge Orders          Ordered    APIXABAN (ELIQUIS) VTE STARTER PACK (10MG  AND 5MG )       Note to Pharmacy: If starter pack unavailable, substitute with seventy-four 5 mg apixaban tabs following the above SIG directions.   08/23/23 1506              [name], PA-C 08/26/23 2309    Glendora Score, MD 08/29/23 [phone]

## 2023-08-23 NOTE — Discharge Instructions (Addendum)
You were seen in the ER for evaluation of your arm and leg pain. I likely think your arm pain is a muscle spasm. Please follow up with your PCP about this on Thursday. For your leg pain, they do see a superficial venous thrombosis. Because of it's length, I have spoken to vascular surgery and they are recommending a blood thinner for the next 45 days. Please follow up with Dr. Darcella Cheshire office. When you call to make an appointment, ask for the DVT clinic. You will take a higher dose for a week, and then a regular dose. As we have previous discussed, there are risks with being on a blood thinner. There is risk of prolonged bleeding, bruising, and/or internal bleeding if you were to get injured. If you hit your head or get in an accident, you will need to return to the ER. It is important to stay compliant with your medications and take as prescribed and to not miss any doses. You need to stop taking your birth control pills and use a back up method of condoms. Please talk to your OB about other options. You do have a slightly elevated glucose and slight low magnesium, please follow up with your PCP. If you are to have any increase in pain, swelling, color changes, fever, chest pain, or shortness or breath, please return to the ER. If you have any concerns, new or worsening symptoms, please return to the nearest ER for re-evaluation.   Please make sure that you follow up with your scheduled PCP appointment on Thursday.   **PLEASE ASK YOUR PCP FOR A PRESCRIPTION FOR AN ADDITIONAL 15 DAYS. You will need to have 45 days of medication for this.   Contact a health care provider if: You miss a dose of your blood thinner, if applicable. Your symptoms do not improve. You have unusual bruising. You have nausea, vomiting, or diarrhea that lasts for more than a day. Get help right away if: You are breathing fast or have chest pain. You have blood in your vomit, urine, or stool. You have severe pain in your affected  arm or leg or new pain in any arm or leg. You have light-headedness, dizziness, a severe headache, or confusion. These symptoms may represent a serious problem that is an emergency. Do not wait to see if the symptoms will go away. Get medical help right away. Call your local emergency services (911 in the [place] Do not drive yourself to the hospital.  Information on my medicine - ELIQUIS (apixaban)  This medication education was reviewed with me or my healthcare representative as part of my discharge preparation.  The pharmacist that spoke with me during my hospital stay was:  Tad Moore, [facility]  Why was Eliquis prescribed for you? Eliquis was prescribed to treat blood clots that may have been found in the veins of your legs (deep vein thrombosis) or in your lungs (pulmonary embolism) and to reduce the risk of them occurring again.  What do You need to know about Eliquis ? The starting dose is 10 mg (two 5 mg tablets) taken TWICE daily for the FIRST SEVEN (7) DAYS, then  the dose is reduced to ONE 5 mg tablet taken TWICE daily.  Eliquis may be taken with or without food.   Try to take the dose about the same time in the morning and in the evening. If you have difficulty swallowing the tablet whole please discuss with your pharmacist how to take the medication safely.  Take Eliquis exactly as prescribed and DO NOT stop taking Eliquis without talking to the doctor who prescribed the medication.  Stopping may increase your risk of developing a new blood clot.  Refill your prescription before you run out.  After discharge, you should have regular check-up appointments with your healthcare provider that is prescribing your Eliquis.    What do you do if you miss a dose? If a dose of ELIQUIS is not taken at the scheduled time, take it as soon as possible on the same day and twice-daily administration should be resumed. The dose should not be doubled to make up for a missed dose.  Important  Safety Information A possible side effect of Eliquis is bleeding. You should call your healthcare provider right away if you experience any of the following: Bleeding from an injury or your nose that does not stop. Unusual colored urine (red or dark brown) or unusual colored stools (red or black). Unusual bruising for unknown reasons. A serious fall or if you hit your head (even if there is no bleeding).  Some medicines may interact with Eliquis and might increase your risk of bleeding or clotting while on Eliquis. To help avoid this, consult your healthcare provider or pharmacist prior to using any new prescription or non-prescription medications, including herbals, vitamins, non-steroidal anti-inflammatory drugs (NSAIDs) and supplements.  This website has more information on Eliquis (apixaban): http://www.eliquis.com/eliquis/home

## 2023-08-23 NOTE — ED Notes (Signed)
Vascular paged to [phone]

## 2023-08-23 NOTE — ED Triage Notes (Signed)
Pt reports right arm pain and swelling. Pt also reports posterior right thigh pain with redness and tenderness.

## 2023-08-23 NOTE — ED Provider Triage Note (Signed)
Emergency Medicine Provider Triage Evaluation Note  Lauren [name] , a 42 y.o. female  was evaluated in triage.  Pt complains of right lower leg swelling with some pain for the past week. Reports it is more medially. She reports it is mainly in the calf and lower posterio/medial thigh. She denies any chest pain, SOB, or trauma to the area. She is also c/o arm pain on the right as well with some swelling and she feels weakness. Denies any trauma or recent heavy lifting. On OCP.  Review of Systems  Positive:  Negative:   Physical Exam  BP (!) 156/102 (BP Location: Left Arm)   Pulse 69   Temp 98.7 F (37.1 C)   Resp 17   SpO2 100%  Gen:   Awake, no distress   Resp:  Normal effort  MSK:   Moves extremities without difficulty.The patient does have some slight swelling noted to the brachioradialis muscle on the right. No overlying warmth or erythema. Tender to palpation. Palpable and symmetric radial pulses. Grip strength is equal. Strength intact without and with resistance and symmetric, but does have pain with doing so. For the lower leg, there is some swelling noted more medially to the area. Some erythema, I do not appreciate any increase in warmth. Compartments soft. Palpable DP and PT pulses.   Other:    Medical Decision Making  Medically screening exam initiated at 10:15 AM.  Appropriate orders placed.  Lauren [name] was informed that the remainder of the evaluation will be completed by another provider, this initial triage assessment does not replace that evaluation, and the importance of remaining in the ED until their evaluation is complete.  I think the arm is likely MSK, however the patient wants imaging. For the leg however I will order DVT study. Labs ordered as well. Mild HTN, otherwise unremarkable vitals.    [name], PA-C 08/23/23 1029

## 2023-08-23 NOTE — TOC Benefit Eligibility Note (Signed)
Pharmacy Patient Advocate Encounter  Insurance verification completed.    The patient is insured through [employer]   Ran test claim for [employer]. Currently a quantity of 74 is a 30 day supply and the co-pay is $4.00 .  Ran test claim for Eliquis. Currently a quantity of 60 is a 30 day supply and the co-pay is $4.00 .  This test claim was processed through [facility]- copay amounts may vary at other pharmacies due to pharmacy/plan contracts, or as the patient moves through the different stages of their insurance plan.

## 2023-08-23 NOTE — Telephone Encounter (Signed)
Copied from CRM [id number]. Topic: Clinical - Red Word Triage >> Aug 23, 2023  8:13 AM Dondra Prader A wrote: Red Word that prompted transfer to Nurse Triage: Pt is stating she has swelling to right elbow and leg that is tender to touch for about one week.   Chief Complaint: Right elbow, forearm swelling and right leg swelling Symptoms: pain, redness, and swelling Frequency: started 1 week ago Pertinent Negatives: Patient denies fever or injury Disposition: [x] ED /[] Urgent Care (no appt availability in office) / [] Appointment(In office/virtual)/ []  [facility]/ [] Home Care/ [] Refused Recommended Disposition /[] [facility]/ []  Follow-up with PCP Additional Notes: Patient reports right elbow and FA swelling with pain and redness, and pain and swelling in right leg behind the knee in the calf and lower thigh area. Patient denies injury to either. Patient reports pain and difficulty moving right arm to do normal activities. Pt reports that she is on birth control. Next available appt on 12/5. Advised patient to go to ED. Pt went ahead and schedule the 08/25/23 appt and stated that she will go to the ED at [facility]   Reason for Disposition  All of arm looks swollen  Answer Assessment - Initial Assessment Questions 1. LOCATION: "Where is the swelling?" (e.g., left, right, both elbows)     On right elbow or top side near forearm  2. SIZE and DESCRIPTION: "What does the swelling look like?" (e.g., entire elbow, localized)     Localized swelling, "not huge" but noticeable, causes whole arm to be hard to move.   3. ONSET: "When did the swelling start?" "Does it come and go, or is it there all the time?"     Started 1 week, noticed in knee area first  4. SETTING: "Has there been any recent work, exercise or other activity that involved that part of the body?"      Reports history of gout  5. AGGRAVATING FACTORS: "What makes the elbow swelling worse?" (e.g., work, sports  activities)     Unable to tell if anything is making swelling worse. If kept straight for too long, it feels stiff. Just noticed that normal activities is slightly painful   6. ASSOCIATED SYMPTOMS: "Is there any pain or redness?"     Pain, tender with touch and movement.   7. OTHER SYMPTOMS: "Do you have any other symptoms?" (e.g., fever)     Inside of knee and leg, warm to touch  8. PREGNANCY: "Is there any chance you are pregnant?" "When was your last menstrual period?"     No, LMP- noticed breakthrough bleeding 2 days ago  Protocols used: Elbow Swelling-A-AH

## 2023-08-23 NOTE — ED Notes (Signed)
Pharmacy called to bring patient medication and educate patient.

## 2023-08-24 LAB — URINE CULTURE: Culture: <10000 — AB

## 2023-08-25 ENCOUNTER — Ambulatory Visit: Payer: Medicaid Other | Admitting: Family Medicine

## 2023-08-25 ENCOUNTER — Encounter: Payer: Self-pay | Admitting: Physician Assistant

## 2023-08-25 ENCOUNTER — Ambulatory Visit: Payer: Medicaid Other | Admitting: Physician Assistant

## 2023-08-25 VITALS — BP 152/92 | HR 84 | Temp 98.2°F | Ht 65.0 in | Wt 181.2 lb

## 2023-08-25 DIAGNOSIS — Z87448 Personal history of other diseases of urinary system: Secondary | ICD-10-CM | POA: Diagnosis not present

## 2023-08-25 DIAGNOSIS — I8001 Phlebitis and thrombophlebitis of superficial vessels of right lower extremity: Secondary | ICD-10-CM

## 2023-08-25 MED ORDER — APIXABAN 5 MG PO TABS: 5.0000 mg | ORAL_TABLET | Freq: Two times a day (BID) | ORAL | 0 refills | Status: DC

## 2023-08-25 NOTE — Patient Instructions (Signed)
Referral placed to nutritionist  Eliquis sent for 15 days.   Repeat US in approx 6 weeks.  Take care, let us know if you need anything else.  Thanks for your patience today! Toni Amend Davien Malone

## 2023-08-25 NOTE — Progress Notes (Signed)
Established Patient Office Visit  Subjective   Patient ID: Lauren [name], female    DOB: [birth year]  Age: 42 y.o. MRN: [id number]  No chief complaint on file.   Patient presenting for ER follow up. 12/3 patient presented to ER for lower R leg pain. US done in ER revealed superficial thrombophlebitis of superficial saphenous vein. Patient started on Eliqus at that time. Today she requests additional 15 days of Eliqus per ER provider, referral to vascular, and exam of R lower leg. She reports she stopped taking her OCPs after diagnosis. She states symptoms are about the same as they were on 12/3. She always request referral to nutrition today.      Review of Systems  Musculoskeletal:        R lower leg pain   Psychiatric/Behavioral:  The patient is nervous/anxious.   All other systems reviewed and are negative.     Objective:     BP (!) 152/92    Physical Exam Constitutional:      General: She is not in acute distress.    Appearance: Normal appearance. She is not ill-appearing.  HENT:     Mouth/Throat:     Mouth: Mucous membranes are moist.     Pharynx: Oropharynx is clear.  Eyes:     Extraocular Movements: Extraocular movements intact.  Neck:     Thyroid: No thyroid mass, thyromegaly or thyroid tenderness.  Cardiovascular:     Rate and Rhythm: Normal rate and regular rhythm.     Heart sounds: No murmur heard.    No gallop.  Pulmonary:     Effort: Pulmonary effort is normal.     Breath sounds: Normal breath sounds. No wheezing, rhonchi or rales.  Abdominal:     General: Abdomen is flat.  Musculoskeletal:        General: Normal range of motion.     Cervical back: Normal range of motion and neck supple.     Right knee: Swelling present. No erythema. Tenderness present.     Right lower leg: Swelling and tenderness present.  Skin:    General: Skin is warm and dry.     Capillary Refill: Capillary refill takes less than 2 seconds.  Neurological:     General: No  focal deficit present.     Mental Status: She is alert and oriented to person, place, and time.  Psychiatric:        Mood and Affect: Mood normal.      No results found for any visits on 08/25/23.  The 10-year ASCVD risk score (Arnett DK, et al., 2019) is: 1.3%    Assessment & Plan:   Case consulted with supervising physician. Eliquis 15 supply sent in today, this allows for 45 day course. Patient's right lower extremity appeared stable today. No increase in swelling, no erythema or warmth noted. Dr. Adriana Simas advised patient that at this time referral to vascular was not needed as thrombophlebitis is a relatively benign diagnosis. 6 week follow up US ordered today.   Blood pressure elevated at today's visit. Patient states increased anxiety. Will follow up on this at her next scheduled visit.   Referral to nutrition sent today. Patient provided material for eduction on renal diet and diet to avoid additional kidney stones.   She understands to follow up in symptoms do not improve or if she begins to experience increased swelling, redness, warmth, or pain in that area.   Return if symptoms worsen or fail to improve.  Thrombophlebitis of superficial veins of right lower extremity -     Apixaban; Take 1 tablet (5 mg total) by mouth 2 (two) times daily.  Dispense: 26 tablet; Refill: 0 -     US Venous Img Lower Unilateral Right (DVT); Future  History of kidney disease -     Amb Referral to Nutrition and Diabetic Education    Agricola Kaye Luoma, PA-C

## 2023-08-26 ENCOUNTER — Encounter: Payer: Self-pay | Admitting: Physician Assistant

## 2023-08-26 ENCOUNTER — Encounter: Payer: Self-pay | Admitting: Family Medicine

## 2023-09-22 DIAGNOSIS — N182 Chronic kidney disease, stage 2 (mild): Secondary | ICD-10-CM | POA: Diagnosis not present

## 2023-09-22 DIAGNOSIS — E559 Vitamin D deficiency, unspecified: Secondary | ICD-10-CM | POA: Diagnosis not present

## 2023-09-30 ENCOUNTER — Other Ambulatory Visit: Payer: Self-pay | Admitting: Physician Assistant

## 2023-09-30 DIAGNOSIS — I8001 Phlebitis and thrombophlebitis of superficial vessels of right lower extremity: Secondary | ICD-10-CM

## 2023-10-03 DIAGNOSIS — I10 Essential (primary) hypertension: Secondary | ICD-10-CM | POA: Diagnosis not present

## 2023-10-03 DIAGNOSIS — R809 Proteinuria, unspecified: Secondary | ICD-10-CM | POA: Diagnosis not present

## 2023-10-03 DIAGNOSIS — N182 Chronic kidney disease, stage 2 (mild): Secondary | ICD-10-CM | POA: Diagnosis not present

## 2023-10-03 DIAGNOSIS — E559 Vitamin D deficiency, unspecified: Secondary | ICD-10-CM | POA: Diagnosis not present

## 2023-10-12 DIAGNOSIS — N200 Calculus of kidney: Secondary | ICD-10-CM | POA: Diagnosis not present

## 2023-10-19 ENCOUNTER — Encounter: Payer: Self-pay | Admitting: Physician Assistant

## 2023-10-21 ENCOUNTER — Ambulatory Visit: Payer: Medicaid Other | Admitting: Physician Assistant

## 2023-10-21 ENCOUNTER — Ambulatory Visit: Payer: Medicaid Other | Admitting: Family Medicine

## 2023-10-21 VITALS — BP 122/74 | HR 75 | Temp 97.3°F | Ht 65.0 in | Wt 186.0 lb

## 2023-10-21 DIAGNOSIS — M7711 Lateral epicondylitis, right elbow: Secondary | ICD-10-CM

## 2023-10-21 MED ORDER — MELOXICAM 15 MG PO TABS: 15.0000 mg | ORAL_TABLET | Freq: Every day | ORAL | 0 refills | Status: DC | PRN

## 2023-10-23 DIAGNOSIS — M7710 Lateral epicondylitis, unspecified elbow: Secondary | ICD-10-CM | POA: Insufficient documentation

## 2023-10-23 NOTE — Assessment & Plan Note (Signed)
Rest. OTC brace. Mobic as directed.

## 2023-10-23 NOTE — Progress Notes (Signed)
Subjective:  Patient ID: Orland Dec, female    DOB: 1980-11-03  Age: 43 y.o. MRN: [id number]  CC:   Chief Complaint  Patient presents with   Arm Pain    Right arm and elbow pain x's 3 months. X ray done in ED. Stinging pain and weakness. Swelling and warm to touch at times.     HPI:  43 year old female presents for evaluation of the above.  Ongoing pain of the right elbow (December). Pain located laterally. Worse with activity (holding objects, cooking, cutting). Hand feels week. Reports some swelling. No relieving factors.   Patient Active Problem List   Diagnosis Date Noted   Lateral epicondylitis 10/23/2023   Mixed hyperlipidemia 04/05/2023   Essential hypertension 04/05/2023   Renal calculi 01/10/2023   Proteinuria 12/28/2022   Depression, major, recurrent, moderate (HCC) 12/10/2021   Generalized anxiety disorder 07/22/2020   Cough variant asthma 04/15/2017    Social Hx   Social History   Socioeconomic History   Marital status: Divorced    Spouse name: Not on file   Number of children: 3   Years of education: 14   Highest education level: Some college, no degree  Occupational History   Not on file  Tobacco Use   Smoking status: Never   Smokeless tobacco: Never  Vaping Use   Vaping status: Never Used  Substance and Sexual Activity   Alcohol use: Yes    Alcohol/week: 2.0 standard drinks of alcohol    Types: 2 Cans of beer per week    Comment: occasional   Drug use: No   Sexual activity: Yes    Birth control/protection: Pill  Other Topics Concern   Not on file  Social History Narrative   Not on file   Social Drivers of Health   Financial Resource Strain: Medium Risk (10/21/2023)   Overall Financial Resource Strain (CARDIA)    Difficulty of Paying Living Expenses: Somewhat hard  Food Insecurity: No Food Insecurity (10/21/2023)   Hunger Vital Sign    Worried About Running Out of Food in the Last Year: Never true    Ran Out of Food in the Last  Year: Never true  Transportation Needs: No Transportation Needs (10/21/2023)   PRAPARE - Administrator, Civil Service (Medical): No    Lack of Transportation (Non-Medical): No  Physical Activity: Insufficiently Active (10/21/2023)   Exercise Vital Sign    Days of Exercise per Week: 1 day    Minutes of Exercise per Session: 20 min  Stress: No Stress Concern Present (10/21/2023)   [employer] of Occupational Health - Occupational Stress Questionnaire    Feeling of Stress : Only a little  Social Connections: Moderately Integrated (10/21/2023)   Social Connection and Isolation Panel [NHANES]    Frequency of Communication with Friends and Family: Twice a week    Frequency of Social Gatherings with Friends and Family: Once a week    Attends Religious Services: More than 4 times per year    Active Member of [employer] or Organizations: Yes    Attends Engineer, structural: More than 4 times per year    Marital Status: Divorced    Review of Systems Per HPI  Objective:  BP 122/74   Pulse 75   Temp (!) 97.3 F (36.3 C)   Ht 5\' 5"  (1.651 m)   Wt 186 lb (84.4 kg)   SpO2 98%   BMI 30.95 kg/m      10/21/2023  11:24 AM 08/25/2023    5:04 PM 08/25/2023    4:34 PM  BP/Weight  Systolic BP 122 152 154  Diastolic BP 74 92 102  Wt. (Lbs) 186  181.2  BMI 30.95 kg/m2  30.15 kg/m2    Physical Exam Vitals and nursing note reviewed.  Constitutional:      General: She is not in acute distress. HENT:     Head: Normocephalic and atraumatic.  Pulmonary:     Effort: Pulmonary effort is normal. No respiratory distress.  Musculoskeletal:     Comments: Right elbow - tenderness over the lateral epicondyle.   Neurological:     Mental Status: She is alert.  Psychiatric:        Mood and Affect: Mood normal.        Behavior: Behavior normal.     Lab Results  Component Value Date   WBC 7.1 08/23/2023   HGB 11.8 (L) 08/23/2023   HCT 37.1 08/23/2023   PLT 292 08/23/2023    GLUCOSE 155 (H) 08/23/2023   CHOL 214 (H) 02/23/2023   TRIG 261 (H) 02/23/2023   HDL 58 02/23/2023   LDLCALC 111 (H) 02/23/2023   ALT 15 08/23/2023   AST 16 08/23/2023   NA 139 08/23/2023   K 3.9 08/23/2023   CL 104 08/23/2023   CREATININE 0.93 08/23/2023   BUN 14 08/23/2023   CO2 25 08/23/2023   TSH 1.830 12/14/2021   INR 0.9 01/10/2023   HGBA1C 6.0 (H) 02/23/2023     Assessment & Plan:   Problem List Items Addressed This Visit       Musculoskeletal and Integument   Lateral epicondylitis - Primary   Rest. OTC brace. Mobic as directed.       Relevant Medications   meloxicam (MOBIC) 15 MG tablet    Meds ordered this encounter  Medications   meloxicam (MOBIC) 15 MG tablet    Sig: Take 1 tablet (15 mg total) by mouth daily as needed.    Dispense:  30 tablet    Refill:  0    Ivory Bail DO [facility]

## 2023-10-31 ENCOUNTER — Telehealth: Payer: Self-pay | Admitting: Family Medicine

## 2023-10-31 MED ORDER — HYDROCHLOROTHIAZIDE 25 MG PO TABS: 25.0000 mg | ORAL_TABLET | Freq: Every day | ORAL | 0 refills | Status: DC

## 2023-10-31 NOTE — Telephone Encounter (Signed)
 Refill on  hydrochlorothiazide  (HYDRODIURIL ) 25 MG tablet  send to [facility] [address]

## 2024-01-22 ENCOUNTER — Other Ambulatory Visit: Payer: Self-pay | Admitting: Family Medicine

## 2024-03-01 DIAGNOSIS — Z124 Encounter for screening for malignant neoplasm of cervix: Secondary | ICD-10-CM | POA: Diagnosis not present

## 2024-03-05 ENCOUNTER — Other Ambulatory Visit: Payer: Self-pay | Admitting: Family Medicine

## 2024-03-05 DIAGNOSIS — R809 Proteinuria, unspecified: Secondary | ICD-10-CM

## 2024-03-05 DIAGNOSIS — E782 Mixed hyperlipidemia: Secondary | ICD-10-CM

## 2024-03-05 DIAGNOSIS — I10 Essential (primary) hypertension: Secondary | ICD-10-CM

## 2024-03-05 DIAGNOSIS — Z130 Encounter for screening for diseases of the blood and blood-forming organs and certain disorders involving the immune mechanism: Secondary | ICD-10-CM

## 2024-03-08 ENCOUNTER — Encounter: Admitting: Family Medicine

## 2024-03-12 ENCOUNTER — Encounter: Admitting: Family Medicine

## 2024-04-05 ENCOUNTER — Ambulatory Visit: Admitting: Family Medicine

## 2024-04-05 VITALS — BP 121/68 | HR 81 | Temp 98.2°F | Ht 65.0 in

## 2024-04-05 DIAGNOSIS — R300 Dysuria: Secondary | ICD-10-CM

## 2024-04-05 LAB — POCT URINALYSIS DIP (CLINITEK)
Bilirubin, UA: NEGATIVE
Glucose, UA: NEGATIVE mg/dL
Ketones, POC UA: NEGATIVE mg/dL
Nitrite, UA: NEGATIVE
Spec Grav, UA: 1.015 (ref 1.010–1.025)
Urobilinogen, UA: 0.2 U/dL
pH, UA: 6 (ref 5.0–8.0)

## 2024-04-05 MED ORDER — CEPHALEXIN 500 MG PO CAPS: 500.0000 mg | ORAL_CAPSULE | Freq: Two times a day (BID) | ORAL | 0 refills | Status: AC

## 2024-04-05 NOTE — Assessment & Plan Note (Signed)
 UA and history suggestive of UTI. Empiric Keflex  while awaiting culture.

## 2024-04-05 NOTE — Progress Notes (Signed)
 Subjective:  Patient ID: Lauren [name], female    DOB: September 07, 1981  Age: 43 y.o. MRN: [id number]  CC:   Chief Complaint  Patient presents with   Dysuria    Dysuria and pressure     HPI:  43 year old female presents for evaluation of the above.  Patient reports symptoms started on Saturday/Sunday. Mild dysuria. Pelvic pressure. No flank pain. No fever. No resolution with Azo and increase in fluids.  Patient Active Problem List   Diagnosis Date Noted   Dysuria 04/05/2024   Lateral epicondylitis 10/23/2023   Mixed hyperlipidemia 04/05/2023   Essential hypertension 04/05/2023   Renal calculi 01/10/2023   Proteinuria 12/28/2022   Depression, major, recurrent, moderate (HCC) 12/10/2021   Generalized anxiety disorder 07/22/2020   Cough variant asthma 04/15/2017    Social Hx   Social History   Socioeconomic History   Marital status: Divorced    Spouse name: Not on file   Number of children: 3   Years of education: 14   Highest education level: Some college, no degree  Occupational History   Not on file  Tobacco Use   Smoking status: Never   Smokeless tobacco: Never  Vaping Use   Vaping status: Never Used  Substance and Sexual Activity   Alcohol use: Yes    Alcohol/week: 2.0 standard drinks of alcohol    Types: 2 Cans of beer per week    Comment: occasional   Drug use: No   Sexual activity: Yes    Birth control/protection: Pill  Other Topics Concern   Not on file  Social History Narrative   Not on file   Social Drivers of Health   Financial Resource Strain: Medium Risk (10/21/2023)   Overall Financial Resource Strain (CARDIA)    Difficulty of Paying Living Expenses: Somewhat hard  Food Insecurity: No Food Insecurity (10/21/2023)   Hunger Vital Sign    Worried About Running Out of Food in the Last Year: Never true    Ran Out of Food in the Last Year: Never true  Transportation Needs: No Transportation Needs (10/21/2023)   PRAPARE - Scientist, research (physical sciences) (Medical): No    Lack of Transportation (Non-Medical): No  Physical Activity: Insufficiently Active (10/21/2023)   Exercise Vital Sign    Days of Exercise per Week: 1 day    Minutes of Exercise per Session: 20 min  Stress: No Stress Concern Present (10/21/2023)   [employer] of Occupational Health - Occupational Stress Questionnaire    Feeling of Stress : Only a little  Social Connections: Moderately Integrated (10/21/2023)   Social Connection and Isolation Panel    Frequency of Communication with Friends and Family: Twice a week    Frequency of Social Gatherings with Friends and Family: Once a week    Attends Religious Services: More than 4 times per year    Active Member of [employer] or Organizations: Yes    Attends Engineer, structural: More than 4 times per year    Marital Status: Divorced    Review of Systems Per HPI  Objective:  BP 121/68   Pulse 81   Temp 98.2 F (36.8 C)   Ht 5' 5 (1.651 m)   SpO2 99%   BMI 30.95 kg/m      04/05/2024    1:38 PM 10/21/2023   11:24 AM 08/25/2023    5:04 PM  BP/Weight  Systolic BP 121 122 152  Diastolic BP 68 74 92  Wt. (Lbs)  186   BMI  30.95 kg/m2     Physical Exam Vitals and nursing note reviewed.  Constitutional:      General: She is not in acute distress.    Appearance: Normal appearance.  HENT:     Head: Normocephalic and atraumatic.  Cardiovascular:     Rate and Rhythm: Regular rhythm.  Pulmonary:     Effort: Pulmonary effort is normal.     Breath sounds: Normal breath sounds.  Abdominal:     General: There is no distension.     Palpations: Abdomen is soft.  Neurological:     Mental Status: She is alert.     Lab Results  Component Value Date   WBC 7.1 08/23/2023   HGB 11.8 (L) 08/23/2023   HCT 37.1 08/23/2023   PLT 292 08/23/2023   GLUCOSE 155 (H) 08/23/2023   CHOL 214 (H) 02/23/2023   TRIG 261 (H) 02/23/2023   HDL 58 02/23/2023   LDLCALC 111 (H) 02/23/2023   ALT 15 08/23/2023    AST 16 08/23/2023   NA 139 08/23/2023   K 3.9 08/23/2023   CL 104 08/23/2023   CREATININE 0.93 08/23/2023   BUN 14 08/23/2023   CO2 25 08/23/2023   TSH 1.830 12/14/2021   INR 0.9 01/10/2023   HGBA1C 6.0 (H) 02/23/2023     Assessment & Plan:  Dysuria Assessment & Plan: UA and history suggestive of UTI. Empiric Keflex  while awaiting culture.  Orders: -     POCT URINALYSIS DIP (CLINITEK) -     Urine Culture  Other orders -     Cephalexin ; Take 1 capsule (500 mg total) by mouth 2 (two) times daily.  Dispense: 14 capsule; Refill: 0    Follow-up:  Return if symptoms worsen or fail to improve.  Jacqulyn Ahle DO [facility]

## 2024-04-08 LAB — URINE CULTURE

## 2024-04-08 LAB — SPECIMEN STATUS REPORT

## 2024-04-09 ENCOUNTER — Ambulatory Visit: Payer: Self-pay

## 2024-07-04 ENCOUNTER — Other Ambulatory Visit: Payer: Self-pay

## 2024-07-04 ENCOUNTER — Encounter (HOSPITAL_COMMUNITY): Payer: Self-pay

## 2024-07-04 ENCOUNTER — Emergency Department (HOSPITAL_COMMUNITY)
Admission: EM | Admit: 2024-07-04 | Discharge: 2024-07-04 | Disposition: A | Discharge status: Home / Self Care | Payer: Self-pay | Attending: Emergency Medicine | Admitting: Emergency Medicine

## 2024-07-04 ENCOUNTER — Emergency Department (HOSPITAL_COMMUNITY): Payer: Self-pay

## 2024-07-04 DIAGNOSIS — M542 Cervicalgia: Secondary | ICD-10-CM | POA: Insufficient documentation

## 2024-07-04 DIAGNOSIS — M2560 Stiffness of unspecified joint, not elsewhere classified: Secondary | ICD-10-CM | POA: Insufficient documentation

## 2024-07-04 DIAGNOSIS — E119 Type 2 diabetes mellitus without complications: Secondary | ICD-10-CM | POA: Insufficient documentation

## 2024-07-04 DIAGNOSIS — Z79899 Other long term (current) drug therapy: Secondary | ICD-10-CM | POA: Insufficient documentation

## 2024-07-04 DIAGNOSIS — I10 Essential (primary) hypertension: Secondary | ICD-10-CM | POA: Insufficient documentation

## 2024-07-04 DIAGNOSIS — J45909 Unspecified asthma, uncomplicated: Secondary | ICD-10-CM | POA: Insufficient documentation

## 2024-07-04 MED ORDER — METHOCARBAMOL 500 MG PO TABS
1000.0000 mg | ORAL_TABLET | Freq: Once | ORAL | Status: AC
  Administered 2024-07-04: 1000 mg via ORAL
  Filled 2024-07-04: qty 2

## 2024-07-04 MED ORDER — KETOROLAC TROMETHAMINE 60 MG/2ML IM SOLN
30.0000 mg | Freq: Once | INTRAMUSCULAR | Status: AC
  Administered 2024-07-04: 30 mg via INTRAMUSCULAR
  Filled 2024-07-04: qty 2

## 2024-07-04 MED ORDER — METHOCARBAMOL 500 MG PO TABS: 1000.0000 mg | ORAL_TABLET | Freq: Three times a day (TID) | ORAL | 0 refills | Status: AC | PRN

## 2024-07-04 NOTE — ED Provider Notes (Signed)
 [place] EMERGENCY DEPARTMENT AT [facility] Provider Note   CSN: [id number] Arrival date & time: 07/04/24  2037     Patient presents with: Neck Pain   Lauren [name] is a 43 y.o. female.    Neck Pain Patient presents for neck pain.  Medical history includes anemia, anxiety, asthma, DM, HLD, HTN.  She states that she will have intermittent neck pain and stiffness which has been occurring over the past year.  2 days ago, she woke up with recurrence of this.  Pain has been primarily right-sided.  It is worsened with lateral rotation of neck.  Pain radiates to area of trapezius.  She denies any pain, numbness, or weakness in her right arm.  She has been taking ibuprofen  and Tylenol  at home with minimal relief.       Prior to Admission medications   Medication Sig Start Date End Date Taking? Authorizing Provider  methocarbamol (ROBAXIN) 500 MG tablet Take 2 tablets (1,000 mg total) by mouth every 8 (eight) hours as needed. 07/04/24  Yes Melvenia Motto, MD  cephALEXin  (KEFLEX ) 500 MG capsule Take 1 capsule (500 mg total) by mouth 2 (two) times daily. 04/05/24   Cook, Jayce G, DO  citric acid -potassium citrate (POLYCITRA) 1100-334 MG/5ML solution Take 10 mLs by mouth 2 (two) times daily. 06/21/24   [provider]  escitalopram  (LEXAPRO ) 10 MG tablet TAKE 1 TABLET(10 MG) BY MOUTH DAILY 01/23/24   Cook, Jayce G, DO  JUNEL FE 1.5/30 1.5-30 MG-MCG tablet Take 1 tablet by mouth daily. 10/24/21   [provider]  norethindrone (MICRONOR) 0.35 MG tablet Take 1 tablet by mouth daily. 01/19/24   [provider]  omeprazole (PRILOSEC) 20 MG capsule Take 20 mg by mouth daily as needed (indigestion).    [provider]  Potassium Citrate 15 MEQ (1620 MG) TBCR Take 1 tablet by mouth 2 (two) times daily. 03/07/23   [provider]  valsartan  (DIOVAN ) 80 MG tablet Take 1 tablet (80 mg total) by mouth daily. 08/04/23   Hoskins, Carolyn C, NP     Allergies: Patient has no known allergies.    Review of Systems  Musculoskeletal:  Positive for neck pain.  All other systems reviewed and are negative.   Updated Vital Signs BP (!) 146/99   Pulse 86   Temp (!) 97.5 F (36.4 C) (Temporal)   Ht 5' 5 (1.651 m)   Wt 86.2 kg   SpO2 97%   BMI 31.62 kg/m   Physical Exam Vitals and nursing note reviewed.  Constitutional:      General: She is not in acute distress.    Appearance: Normal appearance. She is well-developed. She is not ill-appearing, toxic-appearing or diaphoretic.  HENT:     Head: Normocephalic and atraumatic.     Right Ear: External ear normal.     Left Ear: External ear normal.     Nose: Nose normal.     Mouth/Throat:     Mouth: Mucous membranes are moist.  Eyes:     Extraocular Movements: Extraocular movements intact.     Conjunctiva/sclera: Conjunctivae normal.  Cardiovascular:     Rate and Rhythm: Normal rate and regular rhythm.  Pulmonary:     Effort: Pulmonary effort is normal. No respiratory distress.  Abdominal:     General: There is no distension.     Palpations: Abdomen is soft.     Tenderness: There is no abdominal tenderness.  Musculoskeletal:  General: No swelling.     Cervical back: Neck supple.  Skin:    General: Skin is warm and dry.  Neurological:     General: No focal deficit present.     Mental Status: She is alert and oriented to person, place, and time.  Psychiatric:        Mood and Affect: Mood normal.        Behavior: Behavior normal.     (all labs ordered are listed, but only abnormal results are displayed) Labs Reviewed - No data to display  EKG: None  Radiology: CT Cervical Spine Wo Contrast Result Date: 07/04/2024 EXAM: CT CERVICAL SPINE WITHOUT CONTRAST 07/04/2024 10:52:03 PM TECHNIQUE: CT of the cervical spine was performed without the administration of intravenous contrast. Multiplanar reformatted images are provided for review. Automated exposure  control, iterative reconstruction, and/or weight based adjustment of the mA/kV was utilized to reduce the radiation dose to as low as reasonably achievable. COMPARISON: None available. CLINICAL HISTORY: Neck pain, acute, no red flags. c/o right sided neck pain that started a couple of days ago after waking up, pt sees chiropractor weekly for back/neck pain and says this helps, but this pain is getting worse FINDINGS: BONES AND ALIGNMENT: No acute fracture or traumatic malalignment. DEGENERATIVE CHANGES: No significant degenerative changes. SOFT TISSUES: No prevertebral soft tissue swelling. IMPRESSION: 1. No acute abnormality of the cervical spine. Electronically signed by: Pinkie Pebbles MD 07/04/2024 10:54 PM EDT RP Workstation: [id number]     Procedures   Medications Ordered in the ED  ketorolac (TORADOL) injection 30 mg (30 mg Intramuscular Given 07/04/24 2224)  methocarbamol (ROBAXIN) tablet 1,000 mg (1,000 mg Oral Given 07/04/24 2224)                                    Medical Decision Making Amount and/or Complexity of Data Reviewed Radiology: ordered.  Risk Prescription drug management.   Patient presenting for neck pain and stiffness.  This is a recurrent problem for her.  Onset of current episode was 2 days ago.  On arrival in the ED, vital signs notable for moderate hypertension.  Patient is well-appearing on exam.  She has limited range of motion of her neck due to the pain and stiffness.  Pain radiates down right trapezius.  Symptoms are consistent with musculoskeletal etiology.  Toradol and Robaxin were ordered.  CT of cervical spine did not show any acute findings.  Patient did have relief from muscle relaxer while in the ED.  She was prescribed the same to take at home as needed.  She was discharged in stable condition.     Final diagnoses:  Neck pain    ED Discharge Orders          Ordered    methocarbamol (ROBAXIN) 500 MG tablet  Every 8 hours PRN        07/04/24  2304               Melvenia Motto, MD 07/04/24 2305

## 2024-07-04 NOTE — ED Triage Notes (Signed)
 Pt to ED from home with c/o right sided neck pain that started a couple of days ago after waking up, pt sees chiropractor weekly for back/neck pain and says this helps, but this pain is getting worse and worried about something pressing on nerve.

## 2024-07-04 NOTE — Discharge Instructions (Signed)
 Your CT scan did not show any abnormal findings.  A prescription for a muscle relaxer was sent to your pharmacy.  Take as needed.  You can take ibuprofen  and Tylenol  as well. Follow-up with your primary care doctor.
